# Patient Record
Sex: Female | Born: 1975 | Race: Black or African American | Hispanic: No | Marital: Single | Smoking: Former smoker
Health system: Southern US, Community
[De-identification: ages and names within clinical notes are randomized; demographics above are authoritative.]

## PROBLEM LIST (undated history)

## (undated) ENCOUNTER — Inpatient Hospital Stay (HOSPITAL_COMMUNITY): Payer: Self-pay

## (undated) DIAGNOSIS — T7840XA Allergy, unspecified, initial encounter: Secondary | ICD-10-CM

## (undated) DIAGNOSIS — O24419 Gestational diabetes mellitus in pregnancy, unspecified control: Secondary | ICD-10-CM

## (undated) DIAGNOSIS — R011 Cardiac murmur, unspecified: Secondary | ICD-10-CM

## (undated) DIAGNOSIS — IMO0002 Reserved for concepts with insufficient information to code with codable children: Secondary | ICD-10-CM

## (undated) DIAGNOSIS — O30049 Twin pregnancy, dichorionic/diamniotic, unspecified trimester: Secondary | ICD-10-CM

## (undated) DIAGNOSIS — I10 Essential (primary) hypertension: Secondary | ICD-10-CM

## (undated) HISTORY — DX: Essential (primary) hypertension: I10

## (undated) HISTORY — DX: Cardiac murmur, unspecified: R01.1

## (undated) HISTORY — DX: Allergy, unspecified, initial encounter: T78.40XA

## (undated) HISTORY — PX: OTHER SURGICAL HISTORY: SHX169

## (undated) HISTORY — DX: Morbid (severe) obesity due to excess calories: E66.01

## (undated) HISTORY — DX: Gestational diabetes mellitus in pregnancy, unspecified control: O24.419

---

## 2001-03-03 ENCOUNTER — Encounter: Payer: Self-pay | Admitting: Emergency Medicine

## 2001-03-03 ENCOUNTER — Emergency Department (HOSPITAL_COMMUNITY): Admission: EM | Admit: 2001-03-03 | Discharge: 2001-03-03 | Payer: Self-pay | Admitting: Emergency Medicine

## 2002-05-07 ENCOUNTER — Other Ambulatory Visit: Admission: RE | Admit: 2002-05-07 | Discharge: 2002-05-07 | Payer: Self-pay | Admitting: *Deleted

## 2002-07-06 ENCOUNTER — Encounter: Admission: RE | Admit: 2002-07-06 | Discharge: 2002-07-06 | Payer: Self-pay | Admitting: *Deleted

## 2002-07-06 ENCOUNTER — Encounter: Payer: Self-pay | Admitting: *Deleted

## 2006-09-02 ENCOUNTER — Emergency Department (HOSPITAL_COMMUNITY): Admission: EM | Admit: 2006-09-02 | Discharge: 2006-09-02 | Payer: Self-pay | Admitting: Family Medicine

## 2012-01-17 ENCOUNTER — Other Ambulatory Visit: Payer: Self-pay | Admitting: Obstetrics and Gynecology

## 2012-01-17 DIAGNOSIS — Z1231 Encounter for screening mammogram for malignant neoplasm of breast: Secondary | ICD-10-CM

## 2012-02-04 ENCOUNTER — Ambulatory Visit
Admission: RE | Admit: 2012-02-04 | Discharge: 2012-02-04 | Disposition: A | Discharge status: Home / Self Care | Payer: 59 | Source: Ambulatory Visit | Attending: Obstetrics and Gynecology | Admitting: Obstetrics and Gynecology

## 2012-02-04 DIAGNOSIS — Z1231 Encounter for screening mammogram for malignant neoplasm of breast: Secondary | ICD-10-CM

## 2012-02-07 ENCOUNTER — Other Ambulatory Visit: Payer: Self-pay | Admitting: Obstetrics and Gynecology

## 2012-02-07 DIAGNOSIS — R928 Other abnormal and inconclusive findings on diagnostic imaging of breast: Secondary | ICD-10-CM

## 2012-02-11 ENCOUNTER — Other Ambulatory Visit: Payer: 59

## 2012-02-12 ENCOUNTER — Ambulatory Visit
Admission: RE | Admit: 2012-02-12 | Discharge: 2012-02-12 | Disposition: A | Discharge status: Home / Self Care | Payer: 59 | Source: Ambulatory Visit | Attending: Obstetrics and Gynecology | Admitting: Obstetrics and Gynecology

## 2012-02-12 ENCOUNTER — Other Ambulatory Visit: Payer: Self-pay | Admitting: Obstetrics and Gynecology

## 2012-02-12 DIAGNOSIS — R928 Other abnormal and inconclusive findings on diagnostic imaging of breast: Secondary | ICD-10-CM

## 2012-02-12 DIAGNOSIS — N63 Unspecified lump in unspecified breast: Secondary | ICD-10-CM

## 2012-02-13 ENCOUNTER — Other Ambulatory Visit: Payer: 59

## 2012-02-18 ENCOUNTER — Other Ambulatory Visit: Payer: 59

## 2012-02-25 ENCOUNTER — Encounter (INDEPENDENT_AMBULATORY_CARE_PROVIDER_SITE_OTHER): Payer: Self-pay | Admitting: Surgery

## 2012-02-25 ENCOUNTER — Ambulatory Visit (INDEPENDENT_AMBULATORY_CARE_PROVIDER_SITE_OTHER): Payer: 59 | Admitting: Surgery

## 2012-02-25 ENCOUNTER — Other Ambulatory Visit (INDEPENDENT_AMBULATORY_CARE_PROVIDER_SITE_OTHER): Payer: Self-pay | Admitting: Surgery

## 2012-02-25 VITALS — BP 136/90 | HR 78 | Temp 98.4°F | Ht 65.0 in | Wt 238.2 lb

## 2012-02-25 DIAGNOSIS — N63 Unspecified lump in unspecified breast: Secondary | ICD-10-CM

## 2012-02-25 NOTE — Patient Instructions (Addendum)
Lumpectomy, Breast Conserving Surgery A lumpectomy is breast surgery that removes only part of the breast. Another name used may be partial mastectomy. The amount removed varies. Make sure you understand how much of your breast will be removed. Reasons for a lumpectomy:  Any solid breast mass.  Grouped significant nodularity that may be confused with a solitary breast mass. Lumpectomy is the most common form of breast cancer surgery today. The surgeon removes the portion of your breast which contains the tumor (cancer). This is the lump. Some normal tissue around the lump is also removed to be sure that all the tumor has been removed.  If cancer cells are found in the margins where the breast tissue was removed, your surgeon will do more surgery to remove the remaining cancer tissue. This is called re-excision surgery. Radiation and/or chemotherapy treatments are often given following a lumpectomy to kill any cancer cells that could possibly remain.  REASONS YOU MAY NOT BE ABLE TO HAVE BREAST CONSERVING SURGERY:  The tumor is located in more than one place.  Your breast is small and the tumor is large so the breast would be disfigured.  The entire tumor removal is not successful with a lumpectomy.  You cannot commit to a full course of chemotherapy, radiation therapy or are pregnant and cannot have radiation.  You have previously had radiation to the breast to treat cancer. HOW A LUMPECTOMY IS PERFORMED If overnight nursing is not required following a biopsy, a lumpectomy can be performed as a same-day surgery. This can be done in a hospital, clinic, or surgical center. The anesthesia used will depend on your surgeon. They will discuss this with you. A general anesthetic keeps you sleeping through the procedure. LET YOUR CAREGIVERS KNOW ABOUT THE FOLLOWING:  Allergies  Medications taken including herbs, eye drops, over the counter medications, and creams.  Use of steroids (by mouth or  creams)  Previous problems with anesthetics or Novocaine.  Possibility of pregnancy, if this applies  History of blood clots (thrombophlebitis)  History of bleeding or blood problems.  Previous surgery  Other health problems BEFORE THE PROCEDURE You should be present one hour prior to your procedure unless directed otherwise.  AFTER THE PROCEDURE  After surgery, you will be taken to the recovery area where a nurse will watch and check your progress. Once you're awake, stable, and taking fluids well, barring other problems you will be allowed to go home.  Ice packs applied to your operative site may help with discomfort and keep the swelling down.  A small rubber drain may be placed in the breast for a couple of days to prevent a hematoma from developing in the breast.  A pressure dressing may be applied for 24 to 48 hours to prevent bleeding.  Keep the wound dry.  You may resume a normal diet and activities as directed. Avoid strenuous activities affecting the arm on the side of the biopsy site such as tennis, swimming, heavy lifting (more than 10 pounds) or pulling.  Bruising in the breast is normal following this procedure.  Wearing a bra - even to bed - may be more comfortable and also help keep the dressing on.  Change dressings as directed.  Only take over-the-counter or prescription medicines for pain, discomfort, or fever as directed by your caregiver. Call for your results as instructed by your surgeon. Remember it is your responsibility to get the results of your lumpectomy if your surgeon asked you to follow-up. Do not assume   everything is fine if you have not heard from your caregiver. SEEK MEDICAL CARE IF:   There is increased bleeding (more than a small spot) from the wound.  You notice redness, swelling, or increasing pain in the wound.  Pus is coming from wound.  An unexplained oral temperature above 102 F (38.9 C) develops.  You notice a foul smell  coming from the wound or dressing. SEEK IMMEDIATE MEDICAL CARE IF:   You develop a rash.  You have difficulty breathing.  You have any allergic problems. Document Released: 05/14/2006 Document Revised: 06/25/2011 Document Reviewed: 08/15/2006 [facility] Patient Information 2013 [place], [place].  Lumpectomy, Breast Conserving Surgery Care After Please read the instructions outlined below and refer to this sheet in the next few weeks. These discharge instructions provide you with general information on caring for yourself after you leave the hospital. Your surgeon may also give you specific instructions. While your treatment has been planned according to the most current medical practices available, unavoidable complications occasionally occur. If you have any problems or questions after discharge, please call your surgeon. Reasons for a lumpectomy:  Any solid breast mass.  Grouped significant nodularity that may be confused with a solitary breast mass. AFTER THE PROCEDURE  After surgery, you will be taken to the recovery area where a nurse will watch and check your progress. Once you're awake, stable, and taking fluids well, barring other problems you will be allowed to go home.  Ice packs applied to your operative site may help with discomfort and keep the swelling down.  A small rubber drain may be placed in the incision for a couple of days to prevent a hematoma in the breast.  A pressure dressing may be applied for 24 to 48 hours to prevent bleeding.  Keep the wound dry.  You may resume a normal diet and activities as directed. Avoid strenuous activities affecting the arm on the side of the biopsy site such as tennis, swimming, heavy lifting (more than 10 pounds) or pulling.  Bruising in the breast is normal following this procedure.  Wearing a bra - even to bed - may be more comfortable and also help keep the dressing on.  Change dressings as directed.  Only take  over-the-counter or prescription medicines for pain, discomfort, or fever as directed by your caregiver. Call for your results as instructed by your surgeon. Remember it isyour responsibility to get the results of your lumpectomy if your surgeon asked you to follow-up. Do not assume everything is fine if you have not heard from your caregiver. SEEK MEDICAL CARE IF:   There is increased bleeding (more than a small spot) from the wound.  You notice redness, swelling, or increasing pain in the wound.  Pus is coming from wound.  An unexplained oral temperature above 102 F (38.9 C) develops.  You notice a foul smell coming from the wound or dressing. SEEK IMMEDIATE MEDICAL CARE IF:   You develop a rash.  You have difficulty breathing.  You have any allergic problems. Document Released: 04/18/2006 Document Revised: 06/25/2011 Document Reviewed: 03/21/2007 [facility] Patient Information 2013 [place], [place].

## 2012-02-25 NOTE — Progress Notes (Signed)
Patient ID: [name], female   DOB: 07/10/1975, 36 y.o.   MRN: [id number]  Chief Complaint  Patient presents with  . 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 female.  Patient sent at the request of Dr. Boyle from the [facility] [place] do to right breast mass. This has been present for 9 years. It has been relatively stable. It's not causing any discomfort. The patient though is concerned about it and once it removed. Core biopsy was offered to the patient for diagnosis but she would rather undergo removal of the mass. No history of nipple discharge or breast cancer in the family. HPI  Past Medical History  Diagnosis Date  . Heart murmur   . Hypertension     Past Surgical History  Procedure Date  . Reconstructive surgery 10/1976 &1992    Family History  Problem Relation Age of Onset  . Cancer Mother     lymphoma    Social History History  Substance Use Topics  . Smoking status: Current Every Day Smoker -- 0.5 packs/day    Types: Cigarettes  . Smokeless tobacco: Not on file  . Alcohol Use: Yes     Comment: social    No Known Allergies  No current outpatient prescriptions on file.    Review of Systems Review of Systems  Constitutional: Negative for fever, chills and unexpected weight change.  HENT: Negative for hearing loss, congestion, sore throat, trouble swallowing and voice change.   Eyes: Negative for visual disturbance.  Respiratory: Negative for cough and wheezing.   Cardiovascular: Negative for chest pain, palpitations and leg swelling.  Gastrointestinal: Negative for nausea, vomiting, abdominal pain, diarrhea, constipation, blood in stool, abdominal distention and anal bleeding.  Genitourinary: Negative for hematuria, vaginal bleeding and difficulty urinating.  Musculoskeletal: Negative for arthralgias.  Skin: Negative for rash and wound.  Neurological: Negative for seizures, syncope and headaches.  Hematological:  Negative for adenopathy. Does not bruise/bleed easily.  Psychiatric/Behavioral: Negative for confusion.    Blood pressure 136/90, pulse 78, temperature 98.4 F (36.9 C), temperature source Temporal, height 5' 5" (1.651 m), weight 238 lb 3.2 oz (108.047 kg), last menstrual period 01/28/2012, SpO2 99.00%.  Physical Exam Physical Exam  Constitutional: She appears well-developed and well-nourished.  HENT:  Head: Normocephalic and atraumatic.  Eyes: Conjunctivae normal are normal. Pupils are equal, round, and reactive to light.  Neck: Normal range of motion. Neck supple.  Cardiovascular: Normal rate and regular rhythm.   Pulmonary/Chest: Effort normal and breath sounds normal. Right breast exhibits mass. Right breast exhibits no inverted nipple, no nipple discharge, no skin change and no tenderness. Left breast exhibits no inverted nipple, no mass, no nipple discharge, no skin change and no tenderness. Breasts are symmetrical.    Abdominal: Soft. Bowel sounds are normal. She exhibits no distension. There is no tenderness. There is no rebound.    Data Reviewed Clinical Data: Patient presents for additional views of the right  breast as follow-up to a recent screening mammogram suggesting a  possible mass.  DIGITAL DIAGNOSTIC RIGHT MAMMOGRAM AND RIGHT BREAST ULTRASOUND:  Comparison: None.  Findings: Spot compression images demonstrate a predominately  circumscribed ovoid 2 cm mass over the slightly outer upper right  periareolar region.  On physical exam, I palpate no definite focal abnormality over the  slightly outer upper right periareolar region.  Ultrasound is performed, showing a well defined ovoid hypoechoic  solid mass at the 12   o'clock position of the right breast 1 cm from  the nipple corresponding to the mammographic abnormality as this  measures 0.9 x 1.8 x 2.2 cm. 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  IMPRESSION:  Well defined ovoid hypoechoic solid mass at the 12  o'clock position  of the right breast 1 cm from the nipple measuring 0.9 x 1.8 x 2.2  cm.  RECOMMENDATION:  Management options of 6-month imaging follow-up versus biopsy were  discussed. The patient wishes to proceed with biopsy and desires  excisional biopsy as surgical consultation has been arranged.  I have discussed the findings and recommendations with the patient.  Results were also provided in writing at the conclusion of the  visit.  BI-RADS CATEGORY 4: Suspicious abnormality - biopsy should be  considered.   Assessment    Right  Breast mass 2 cm    Plan    Pt would like this removed.  Recommend right breat needle localized partial mastectomy. The procedure has been discussed with the patient. Alternatives to surgery have been discussed with the patient.  Risks of surgery include bleeding,  Infection,  Seroma formation, death,  and the need for further surgery.   The patient understands and wishes to proceed.       Melanie Kun A. 02/25/2012, 11:34 AM    

## 2012-02-27 ENCOUNTER — Encounter (HOSPITAL_BASED_OUTPATIENT_CLINIC_OR_DEPARTMENT_OTHER): Payer: Self-pay | Admitting: *Deleted

## 2012-03-03 ENCOUNTER — Ambulatory Visit
Admission: RE | Admit: 2012-03-03 | Discharge: 2012-03-03 | Disposition: A | Discharge status: Home / Self Care | Payer: 59 | Source: Ambulatory Visit | Attending: Surgery | Admitting: Surgery

## 2012-03-03 ENCOUNTER — Encounter (HOSPITAL_BASED_OUTPATIENT_CLINIC_OR_DEPARTMENT_OTHER)
Admission: RE | Admit: 2012-03-03 | Discharge: 2012-03-03 | Disposition: A | Discharge status: Home / Self Care | Payer: 59 | Source: Ambulatory Visit | Attending: Surgery | Admitting: Surgery

## 2012-03-03 LAB — COMPREHENSIVE METABOLIC PANEL
ALT: 13 U/L (ref 0–35)
Calcium: 9.5 mg/dL (ref 8.4–10.5)
Creatinine, Ser: 0.67 mg/dL (ref 0.50–1.10)
GFR calc Af Amer: >90 mL/min (ref >90)
GFR calc non Af Amer: >90 mL/min (ref >90)
Glucose, Bld: 114 mg/dL — ABNORMAL HIGH (ref 70–99)
Sodium: 136 mEq/L (ref 135–145)
Total Protein: 7.7 g/dL (ref 6.0–8.3)

## 2012-03-05 ENCOUNTER — Encounter (HOSPITAL_BASED_OUTPATIENT_CLINIC_OR_DEPARTMENT_OTHER): Payer: Self-pay | Admitting: *Deleted

## 2012-03-05 ENCOUNTER — Encounter (HOSPITAL_BASED_OUTPATIENT_CLINIC_OR_DEPARTMENT_OTHER)
Admission: RE | Disposition: A | Discharge status: Home / Self Care | Payer: Self-pay | Source: Ambulatory Visit | Attending: Surgery

## 2012-03-05 ENCOUNTER — Ambulatory Visit (HOSPITAL_BASED_OUTPATIENT_CLINIC_OR_DEPARTMENT_OTHER): Payer: 59 | Admitting: Certified Registered"

## 2012-03-05 ENCOUNTER — Ambulatory Visit
Admission: RE | Admit: 2012-03-05 | Discharge: 2012-03-05 | Disposition: A | Discharge status: Home / Self Care | Payer: 59 | Source: Ambulatory Visit | Attending: Surgery | Admitting: Surgery

## 2012-03-05 ENCOUNTER — Encounter (HOSPITAL_BASED_OUTPATIENT_CLINIC_OR_DEPARTMENT_OTHER): Payer: Self-pay | Admitting: Certified Registered"

## 2012-03-05 ENCOUNTER — Ambulatory Visit (HOSPITAL_BASED_OUTPATIENT_CLINIC_OR_DEPARTMENT_OTHER)
Admission: RE | Admit: 2012-03-05 | Discharge: 2012-03-05 | Disposition: A | Discharge status: Home / Self Care | Payer: 59 | Source: Ambulatory Visit | Attending: Surgery | Admitting: Surgery

## 2012-03-05 DIAGNOSIS — Z01812 Encounter for preprocedural laboratory examination: Secondary | ICD-10-CM | POA: Insufficient documentation

## 2012-03-05 DIAGNOSIS — F17200 Nicotine dependence, unspecified, uncomplicated: Secondary | ICD-10-CM | POA: Insufficient documentation

## 2012-03-05 DIAGNOSIS — I10 Essential (primary) hypertension: Secondary | ICD-10-CM | POA: Insufficient documentation

## 2012-03-05 DIAGNOSIS — N63 Unspecified lump in unspecified breast: Secondary | ICD-10-CM

## 2012-03-05 DIAGNOSIS — D249 Benign neoplasm of unspecified breast: Secondary | ICD-10-CM | POA: Insufficient documentation

## 2012-03-05 HISTORY — PX: PARTIAL MASTECTOMY WITH NEEDLE LOCALIZATION: SHX6008

## 2012-03-05 SURGERY — PARTIAL MASTECTOMY WITH NEEDLE LOCALIZATION
Anesthesia: General | Site: Breast | Laterality: Right | Wound class: Clean

## 2012-03-05 MED ORDER — PROPOFOL 10 MG/ML IV BOLUS
INTRAVENOUS | Status: DC | PRN
  Administered 2012-03-05: 200 mg via INTRAVENOUS

## 2012-03-05 MED ORDER — FENTANYL CITRATE 0.05 MG/ML IJ SOLN
INTRAMUSCULAR | Status: DC | PRN
  Administered 2012-03-05: 100 ug via INTRAVENOUS

## 2012-03-05 MED ORDER — OXYCODONE HCL 5 MG/5ML PO SOLN: 5.0000 mg | Freq: Once | ORAL | Status: AC | PRN

## 2012-03-05 MED ORDER — BUPIVACAINE-EPINEPHRINE 0.25% -1:200000 IJ SOLN
INTRAMUSCULAR | Status: DC | PRN
  Administered 2012-03-05: 10 mL

## 2012-03-05 MED ORDER — ONDANSETRON HCL 4 MG/2ML IJ SOLN: 4.0000 mg | Freq: Once | INTRAMUSCULAR | Status: DC | PRN

## 2012-03-05 MED ORDER — 0.9 % SODIUM CHLORIDE (POUR BTL) OPTIME
TOPICAL | Status: DC | PRN
  Administered 2012-03-05: 200 mL

## 2012-03-05 MED ORDER — OXYCODONE HCL 5 MG PO TABS
5.0000 mg | ORAL_TABLET | Freq: Once | ORAL | Status: AC | PRN
  Administered 2012-03-05: 5 mg via ORAL

## 2012-03-05 MED ORDER — HYDROCODONE-ACETAMINOPHEN 5-325 MG PO TABS: 1.0000 | ORAL_TABLET | Freq: Four times a day (QID) | ORAL | Status: DC | PRN

## 2012-03-05 MED ORDER — HYDROMORPHONE HCL PF 1 MG/ML IJ SOLN: 0.2500 mg | INTRAMUSCULAR | Status: DC | PRN

## 2012-03-05 MED ORDER — ONDANSETRON HCL 4 MG/2ML IJ SOLN
INTRAMUSCULAR | Status: DC | PRN
  Administered 2012-03-05: 4 mg via INTRAVENOUS

## 2012-03-05 MED ORDER — DEXAMETHASONE SODIUM PHOSPHATE 4 MG/ML IJ SOLN
INTRAMUSCULAR | Status: DC | PRN
  Administered 2012-03-05: 8 mg via INTRAVENOUS

## 2012-03-05 MED ORDER — MIDAZOLAM HCL 5 MG/5ML IJ SOLN
INTRAMUSCULAR | Status: DC | PRN
  Administered 2012-03-05: 2 mg via INTRAVENOUS

## 2012-03-05 MED ORDER — DEXTROSE 5 % IV SOLN
3.0000 g | INTRAVENOUS | Status: AC
  Administered 2012-03-05: 3 g via INTRAVENOUS

## 2012-03-05 MED ORDER — LIDOCAINE HCL (CARDIAC) 20 MG/ML IV SOLN
INTRAVENOUS | Status: DC | PRN
  Administered 2012-03-05: 50 mg via INTRAVENOUS

## 2012-03-05 MED ORDER — CHLORHEXIDINE GLUCONATE 4 % EX LIQD: 1.0000 "application " | Freq: Once | CUTANEOUS | Status: DC

## 2012-03-05 MED ORDER — LACTATED RINGERS IV SOLN
INTRAVENOUS | Status: DC
  Administered 2012-03-05: 12:00:00 via INTRAVENOUS

## 2012-03-05 SURGICAL SUPPLY — 45 items
ADH SKN CLS APL DERMABOND .7 (GAUZE/BANDAGES/DRESSINGS) ×1
BLADE SURG 15 STRL LF DISP TIS (BLADE) ×1 IMPLANT
BLADE SURG 15 STRL SS (BLADE) ×2
CANISTER SUCTION 1200CC (MISCELLANEOUS) ×2 IMPLANT
CHLORAPREP W/TINT 26ML (MISCELLANEOUS) ×2 IMPLANT
CLIP TI WIDE RED SMALL 6 (CLIP) IMPLANT
CLOTH BEACON ORANGE TIMEOUT ST (SAFETY) ×2 IMPLANT
COVER MAYO STAND STRL (DRAPES) ×2 IMPLANT
COVER TABLE BACK 60X90 (DRAPES) ×2 IMPLANT
DECANTER SPIKE VIAL GLASS SM (MISCELLANEOUS) ×1 IMPLANT
DERMABOND ADVANCED (GAUZE/BANDAGES/DRESSINGS) ×1
DERMABOND ADVANCED .7 DNX12 (GAUZE/BANDAGES/DRESSINGS) ×1 IMPLANT
DEVICE DUBIN W/COMP PLATE 8390 (MISCELLANEOUS) ×1 IMPLANT
DRAPE LAPAROSCOPIC ABDOMINAL (DRAPES) IMPLANT
DRAPE PED LAPAROTOMY (DRAPES) ×2 IMPLANT
DRAPE UTILITY XL STRL (DRAPES) ×2 IMPLANT
ELECT COATED BLADE 2.86 ST (ELECTRODE) ×2 IMPLANT
ELECT REM PT RETURN 9FT ADLT (ELECTROSURGICAL) ×2
ELECTRODE REM PT RTRN 9FT ADLT (ELECTROSURGICAL) ×1 IMPLANT
GLOVE BIO SURGEON STRL SZ7 (GLOVE) ×1 IMPLANT
GLOVE BIOGEL PI IND STRL 7.0 (GLOVE) IMPLANT
GLOVE BIOGEL PI IND STRL 8 (GLOVE) ×1 IMPLANT
GLOVE BIOGEL PI INDICATOR 7.0 (GLOVE) ×1
GLOVE BIOGEL PI INDICATOR 8 (GLOVE) ×1
GLOVE ECLIPSE 8.0 STRL XLNG CF (GLOVE) ×2 IMPLANT
GOWN PREVENTION PLUS XLARGE (GOWN DISPOSABLE) ×3 IMPLANT
KIT MARKER MARGIN INK (KITS) IMPLANT
NDL HYPO 25X1 1.5 SAFETY (NEEDLE) ×1 IMPLANT
NEEDLE HYPO 25X1 1.5 SAFETY (NEEDLE) ×2 IMPLANT
NS IRRIG 1000ML POUR BTL (IV SOLUTION) ×2 IMPLANT
PACK BASIN DAY SURGERY FS (CUSTOM PROCEDURE TRAY) ×2 IMPLANT
PENCIL BUTTON HOLSTER BLD 10FT (ELECTRODE) ×2 IMPLANT
SLEEVE SCD COMPRESS KNEE MED (MISCELLANEOUS) ×2 IMPLANT
SPONGE LAP 4X18 X RAY DECT (DISPOSABLE) ×2 IMPLANT
STAPLER VISISTAT 35W (STAPLE) IMPLANT
SUT MON AB 4-0 PC3 18 (SUTURE) ×2 IMPLANT
SUT SILK 2 0 SH (SUTURE) IMPLANT
SUT VIC AB 3-0 SH 27 (SUTURE) ×2
SUT VIC AB 3-0 SH 27X BRD (SUTURE) ×1 IMPLANT
SYR CONTROL 10ML LL (SYRINGE) ×2 IMPLANT
TOWEL OR 17X24 6PK STRL BLUE (TOWEL DISPOSABLE) ×3 IMPLANT
TOWEL OR NON WOVEN STRL DISP B (DISPOSABLE) ×2 IMPLANT
TUBE CONNECTING 20X1/4 (TUBING) ×2 IMPLANT
WATER STERILE IRR 1000ML POUR (IV SOLUTION) ×1 IMPLANT
YANKAUER SUCT BULB TIP NO VENT (SUCTIONS) ×2 IMPLANT

## 2012-03-05 NOTE — Interval H&P Note (Signed)
History and Physical Interval Note:  03/05/2012 12:24 PM  [name]  has presented today for surgery, with the diagnosis of right breast mass  The various methods of treatment have been discussed with the patient and family. After consideration of risks, benefits and other options for treatment, the patient has consented to  Procedure(s) (LRB) with comments: PARTIAL MASTECTOMY WITH NEEDLE LOCALIZATION (Right) as a surgical intervention .  The patient's history has been reviewed, patient examined, no change in status, stable for surgery.  I have reviewed the patient's chart and labs.  Questions were answered to the patient's satisfaction.     Darryl Blumenstein A.

## 2012-03-05 NOTE — Transfer of Care (Signed)
Immediate Anesthesia Transfer of Care Note  Patient: [name]  Procedure(s) Performed: Procedure(s) (LRB) with comments: PARTIAL MASTECTOMY WITH NEEDLE LOCALIZATION (Right)  Patient Location: PACU  Anesthesia Type:General  Level of Consciousness: awake and alert   Airway & Oxygen Therapy: Patient Spontanous Breathing and Patient connected to face mask oxygen  Post-op Assessment: Report given to PACU RN and Post -op Vital signs reviewed and stable  Post vital signs: Reviewed and stable  Complications: No apparent anesthesia complications

## 2012-03-05 NOTE — Anesthesia Procedure Notes (Signed)
Procedure Name: LMA Insertion Performed by: Fernie Grimm, [place] Pre-anesthesia Checklist: Patient identified, Timeout performed, Emergency Drugs available, Suction available and Patient being monitored Patient Re-evaluated:Patient Re-evaluated prior to inductionOxygen Delivery Method: Circle system utilized Preoxygenation: Pre-oxygenation with 100% oxygen Intubation Type: IV induction Ventilation: Mask ventilation without difficulty LMA: LMA inserted LMA Size: 4.0 Number of attempts: 1 Placement Confirmation: breath sounds checked- equal and bilateral and positive ETCO2 Dental Injury: Teeth and Oropharynx as per pre-operative assessment      

## 2012-03-05 NOTE — Anesthesia Preprocedure Evaluation (Addendum)
Anesthesia Evaluation  Patient identified by MRN, date of birth, ID band Patient awake    Reviewed: Allergy & Precautions, H&P , NPO status , Patient's Chart, lab work & pertinent test results  Airway Mallampati: I TM Distance: >3 FB Neck ROM: Full    Dental  (+) Teeth Intact, Dental Advisory Given and Partial Upper   Pulmonary  breath sounds clear to auscultation        Cardiovascular Rhythm:Regular Rate:Normal     Neuro/Psych    GI/Hepatic   Endo/Other    Renal/GU      Musculoskeletal   Abdominal   Peds  Hematology   Anesthesia Other Findings   Reproductive/Obstetrics                          Anesthesia Physical Anesthesia Plan  ASA: II  Anesthesia Plan: General   Post-op Pain Management:    Induction: Intravenous  Airway Management Planned: LMA  Additional Equipment:   Intra-op Plan:   Post-operative Plan: Extubation in OR  Informed Consent: I have reviewed the patients History and Physical, chart, labs and discussed the procedure including the risks, benefits and alternatives for the proposed anesthesia with the patient or authorized representative who has indicated his/her understanding and acceptance.   Dental advisory given  Plan Discussed with: CRNA, Anesthesiologist and Surgeon  Anesthesia Plan Comments:         Anesthesia Quick Evaluation

## 2012-03-05 NOTE — Anesthesia Postprocedure Evaluation (Signed)
  Anesthesia Post-op Note  Patient: [name]  Procedure(s) Performed: Procedure(s) (LRB) with comments: PARTIAL MASTECTOMY WITH NEEDLE LOCALIZATION (Right)  Patient Location: PACU  Anesthesia Type:General  Level of Consciousness: awake, alert  and oriented  Airway and Oxygen Therapy: Patient Spontanous Breathing and Patient connected to face mask oxygen  Post-op Pain: none  Post-op Assessment: Post-op Vital signs reviewed  Post-op Vital Signs: Reviewed  Complications: No apparent anesthesia complications

## 2012-03-05 NOTE — Op Note (Signed)
Preoperative diagnosis: right breast mass  Postop diagnosis: Same  Procedure:  Right Partial mastectomy with wire localization  Surgeon: Harriette Bouillon M.D.  Anesthesia: LMA with 0.25% Sensorcaine local  EBL: Less than 40 cc  Specimen: Right  Breast mass with wire  verified by radiography to pathology  Drains: None  Indications for procedure: The patient presents with a right  breast mass. The patient wants to proceed with partial mastectomy with wire localization.  Description of procedure: The patient was seen in the holding area and the appropriate side was marked. Questions are answered. Wire localization was done by  radiology. The patient was taken back to the operating room and placed supine on the operating room table. After induction of general anesthesia, chest and upper extremities   were prepped and draped in a sterile fashion. Timeout was done and she received preoperative antibiotics. Curvilinear incision was made around the wire insertion site in the right lateral breast. All tissue around the wire was excised and hemostasis was achieved with cautery. The area was removed in its entirety upon gross examination. Gross margin negative. Radiograph revealed the mass, wire and clip to be in the specimen. The wound was closed in layers using 3-0 Vicryl and 4-0 Monocryl subcuticular stitch. Dermabond applied. All final counts found to be correct. Patient awoke extubated taken recovery in satisfactory condition.

## 2012-03-05 NOTE — H&P (View-Only) (Signed)
Patient ID: [name], female   DOB: May 29, 1975, 36 y.o.   MRN: [id number]  Chief Complaint  Patient presents with  . 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 female.  Patient sent at the request of Dr. Micheline Maze from the breast [facility] do to right breast mass. This has been present for 9 years. It has been relatively stable. It's not causing any discomfort. The patient though is concerned about it and once it removed. Core biopsy was offered to the patient for diagnosis but she would rather undergo removal of the mass. No history of nipple discharge or breast cancer in the family. HPI  Past Medical History  Diagnosis Date  . Heart murmur   . Hypertension     Past Surgical History  Procedure Date  . Reconstructive surgery Sep 08, 1976 &1992    Family History  Problem Relation Age of Onset  . Cancer Mother     lymphoma    Social History History  Substance Use Topics  . Smoking status: Current Every Day Smoker -- 0.5 packs/day    Types: Cigarettes  . Smokeless tobacco: Not on file  . Alcohol Use: Yes     Comment: social    No Known Allergies  No current outpatient prescriptions on file.    Review of Systems Review of Systems  Constitutional: Negative for fever, chills and unexpected weight change.  HENT: Negative for hearing loss, congestion, sore throat, trouble swallowing and voice change.   Eyes: Negative for visual disturbance.  Respiratory: Negative for cough and wheezing.   Cardiovascular: Negative for chest pain, palpitations and leg swelling.  Gastrointestinal: Negative for nausea, vomiting, abdominal pain, diarrhea, constipation, blood in stool, abdominal distention and anal bleeding.  Genitourinary: Negative for hematuria, vaginal bleeding and difficulty urinating.  Musculoskeletal: Negative for arthralgias.  Skin: Negative for rash and wound.  Neurological: Negative for seizures, syncope and headaches.  Hematological:  Negative for adenopathy. Does not bruise/bleed easily.  Psychiatric/Behavioral: Negative for confusion.    Blood pressure 136/90, pulse 78, temperature 98.4 F (36.9 C), temperature source Temporal, height 5\' 5"  (1.651 m), weight 238 lb 3.2 oz (108.047 kg), last menstrual period 01/28/2012, SpO2 99.00%.  Physical Exam Physical Exam  Constitutional: She appears well-developed and well-nourished.  HENT:  Head: Normocephalic and atraumatic.  Eyes: Conjunctivae normal are normal. Pupils are equal, round, and reactive to light.  Neck: Normal range of motion. Neck supple.  Cardiovascular: Normal rate and regular rhythm.   Pulmonary/Chest: Effort normal and breath sounds normal. Right breast exhibits mass. Right breast exhibits no inverted nipple, no nipple discharge, no skin change and no tenderness. Left breast exhibits no inverted nipple, no mass, no nipple discharge, no skin change and no tenderness. Breasts are symmetrical.    Abdominal: Soft. Bowel sounds are normal. She exhibits no distension. There is no tenderness. There is no rebound.    Data Reviewed Clinical Data: Patient presents for additional views of the right  breast as follow-up to a recent screening mammogram suggesting a  possible mass.  DIGITAL DIAGNOSTIC RIGHT MAMMOGRAM AND RIGHT BREAST ULTRASOUND:  Comparison: None.  Findings: Spot compression images demonstrate a predominately  circumscribed ovoid 2 cm mass over the slightly outer upper right  periareolar region.  On physical exam, I palpate no definite focal abnormality over the  slightly outer upper right periareolar region.  Ultrasound is performed, showing a well defined ovoid hypoechoic  solid mass at the 12  o'clock position of the right breast 1 cm from  the nipple corresponding to the mammographic abnormality as this  measures 0.9 x 1.8 x 2.2 cm. 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  IMPRESSION:  Well defined ovoid hypoechoic solid mass at the 12  o'clock position  of the right breast 1 cm from the nipple measuring 0.9 x 1.8 x 2.2  cm.  RECOMMENDATION:  Management options of 57-month imaging follow-up versus biopsy were  discussed. The patient wishes to proceed with biopsy and desires  excisional biopsy as surgical consultation has been arranged.  I have discussed the findings and recommendations with the patient.  Results were also provided in writing at the conclusion of the  visit.  BI-RADS CATEGORY 4: Suspicious abnormality - biopsy should be  considered.   Assessment    Right  Breast mass 2 cm    Plan    Pt would like this removed.  Recommend right breat needle localized partial mastectomy. The procedure has been discussed with the patient. Alternatives to surgery have been discussed with the patient.  Risks of surgery include bleeding,  Infection,  Seroma formation, death,  and the need for further surgery.   The patient understands and wishes to proceed.       Requan Hardge A. 02/25/2012, 11:34 AM

## 2012-03-06 ENCOUNTER — Encounter (HOSPITAL_BASED_OUTPATIENT_CLINIC_OR_DEPARTMENT_OTHER): Payer: Self-pay | Admitting: Surgery

## 2012-03-07 ENCOUNTER — Telehealth (INDEPENDENT_AMBULATORY_CARE_PROVIDER_SITE_OTHER): Payer: Self-pay | Admitting: General Surgery

## 2012-03-07 NOTE — Telephone Encounter (Signed)
Pt called for path results; told there was no malignancy found.

## 2012-03-18 ENCOUNTER — Encounter (INDEPENDENT_AMBULATORY_CARE_PROVIDER_SITE_OTHER): Payer: 59 | Admitting: Surgery

## 2012-03-20 ENCOUNTER — Telehealth (INDEPENDENT_AMBULATORY_CARE_PROVIDER_SITE_OTHER): Payer: Self-pay

## 2012-03-20 NOTE — Telephone Encounter (Signed)
The pt called reporting a little bit of bloody drainage in her bra last night.  She has no fever and no redness.  She has been showering and she placed a sterile gauze pad in her bra today.  It is dry now. She states the breast is a little bigger than the other but it always has been.  Her dermabond has started to peel up but the incision is not open.   I told her to watch this for now.  It may not happen again.  I advised keep showering and gently pat it dry.  Keep a dry gauze in the bra and let us know if it happens again.  She has an appointment with Dr Luisa Hart 12/16 but I told her we can have her seen tomorrow before the weekend if we need.

## 2012-03-31 ENCOUNTER — Encounter (INDEPENDENT_AMBULATORY_CARE_PROVIDER_SITE_OTHER): Payer: Self-pay | Admitting: Surgery

## 2012-03-31 ENCOUNTER — Ambulatory Visit (INDEPENDENT_AMBULATORY_CARE_PROVIDER_SITE_OTHER): Payer: 59 | Admitting: Surgery

## 2012-03-31 VITALS — BP 124/88 | HR 72 | Temp 97.6°F | Resp 16 | Ht 66.0 in | Wt 237.4 lb

## 2012-03-31 DIAGNOSIS — Z9889 Other specified postprocedural states: Secondary | ICD-10-CM

## 2012-03-31 NOTE — Patient Instructions (Signed)
RETURN AS NEEDED 

## 2012-03-31 NOTE — Progress Notes (Signed)
Patient returns after left breast partial mastectomy. Final pathology showed a 2.2 cm fibroadenoma. She does have some soreness around the incision left nipple.  Exam: Incision clean dry and intact without signs of infection  Impression: Left breast fibroadenoma status post excision  Plan: Return as needed. Resume full activity.

## 2012-12-02 LAB — OB RESULTS CONSOLE ABO/RH: RH TYPE: NEGATIVE

## 2012-12-02 LAB — OB RESULTS CONSOLE HIV ANTIBODY (ROUTINE TESTING): HIV: NONREACTIVE

## 2012-12-02 LAB — OB RESULTS CONSOLE RPR: RPR: NONREACTIVE

## 2012-12-02 LAB — OB RESULTS CONSOLE RUBELLA ANTIBODY, IGM: RUBELLA: IMMUNE

## 2012-12-02 LAB — OB RESULTS CONSOLE HEPATITIS B SURFACE ANTIGEN: HEP B S AG: NEGATIVE

## 2012-12-02 LAB — OB RESULTS CONSOLE ANTIBODY SCREEN: ANTIBODY SCREEN: NEGATIVE

## 2013-04-14 ENCOUNTER — Encounter: Payer: 59 | Attending: Obstetrics and Gynecology

## 2013-04-14 VITALS — Ht 66.0 in | Wt 268.7 lb

## 2013-04-14 DIAGNOSIS — Z713 Dietary counseling and surveillance: Secondary | ICD-10-CM | POA: Insufficient documentation

## 2013-04-14 DIAGNOSIS — O99810 Abnormal glucose complicating pregnancy: Secondary | ICD-10-CM

## 2013-04-14 NOTE — Progress Notes (Signed)
  Patient was seen on 04/14/13 for Gestational Diabetes self-management class at the [facility]. The following learning objectives were met by the patient during this course:   States the definition of Gestational Diabetes  States why dietary management is important in controlling blood glucose  Describes the effects of carbohydrates on blood glucose levels  Demonstrates ability to create a balanced meal plan  Demonstrates carbohydrate counting   States when to check blood glucose levels  Demonstrates proper blood glucose monitoring techniques  States the effect of stress and exercise on blood glucose levels  States the importance of limiting caffeine and abstaining from alcohol and smoking  Plan:  Aim for 2 Carb Choices per meal (30 grams) +/- 1 either way for breakfast Aim for 3 Carb Choices per meal (45 grams) +/- 1 either way from lunch and dinner Aim for 1-2 Carbs per snack Begin reading food labels for Total Carbohydrate and sugar grams of foods Consider  increasing your activity level by walking daily as tolerated Begin checking BG before breakfast and 1-2 hours after first bit of breakfast, lunch and dinner after  as directed by MD  Take medication  as directed by MD  Blood glucose monitor given:  One Touch Ultra Mini Self Monitoring Kit Lot # [id number] X Exp: 06/2014 Blood glucose reading: 83  Patient instructed to monitor glucose levels: FBS: 60 - <90 1 hour: <140 2 hour: <120  Patient received the following handouts:  Nutrition Diabetes and Pregnancy  Carbohydrate Counting List  Meal Planning worksheet  Patient will be seen for follow-up as needed.

## 2013-05-04 ENCOUNTER — Inpatient Hospital Stay (HOSPITAL_COMMUNITY)
Admission: AD | Admit: 2013-05-04 | Discharge: 2013-05-05 | Disposition: A | Discharge status: Home / Self Care | Payer: 59 | Source: Ambulatory Visit | Attending: Obstetrics and Gynecology | Admitting: Obstetrics and Gynecology

## 2013-05-04 DIAGNOSIS — O99810 Abnormal glucose complicating pregnancy: Secondary | ICD-10-CM | POA: Insufficient documentation

## 2013-05-04 DIAGNOSIS — O30009 Twin pregnancy, unspecified number of placenta and unspecified number of amniotic sacs, unspecified trimester: Secondary | ICD-10-CM | POA: Insufficient documentation

## 2013-05-04 DIAGNOSIS — O10019 Pre-existing essential hypertension complicating pregnancy, unspecified trimester: Secondary | ICD-10-CM | POA: Insufficient documentation

## 2013-05-04 DIAGNOSIS — O163 Unspecified maternal hypertension, third trimester: Secondary | ICD-10-CM | POA: Diagnosis present

## 2013-05-05 ENCOUNTER — Encounter (HOSPITAL_COMMUNITY): Payer: Self-pay | Admitting: *Deleted

## 2013-05-05 DIAGNOSIS — O163 Unspecified maternal hypertension, third trimester: Secondary | ICD-10-CM | POA: Diagnosis present

## 2013-05-05 LAB — CBC
HCT: 32.2 % — ABNORMAL LOW (ref 36.0–46.0)
Hemoglobin: 11 g/dL — ABNORMAL LOW (ref 12.0–15.0)
MCH: 31.8 pg (ref 26.0–34.0)
MCHC: 34.2 g/dL (ref 30.0–36.0)
MCV: 93.1 fL (ref 78.0–100.0)
Platelets: 186 10*3/uL (ref 150–400)
RBC: 3.46 MIL/uL — ABNORMAL LOW (ref 3.87–5.11)
RDW: 13.1 % (ref 11.5–15.5)
WBC: 6.9 10*3/uL (ref 4.0–10.5)

## 2013-05-05 LAB — PROTEIN / CREATININE RATIO, URINE
Creatinine, Urine: 91.64 mg/dL
Protein Creatinine Ratio: 0.2 — ABNORMAL HIGH (ref 0.00–0.15)
Total Protein, Urine: 18.7 mg/dL

## 2013-05-05 LAB — URIC ACID: Uric Acid, Serum: 5.6 mg/dL (ref 2.4–7.0)

## 2013-05-05 LAB — COMPREHENSIVE METABOLIC PANEL
ALT: 18 U/L (ref 0–35)
AST: 20 U/L (ref 0–37)
Albumin: 2.7 g/dL — ABNORMAL LOW (ref 3.5–5.2)
Alkaline Phosphatase: 149 U/L — ABNORMAL HIGH (ref 39–117)
BUN: 7 mg/dL (ref 6–23)
CO2: 20 mEq/L (ref 19–32)
Calcium: 9.6 mg/dL (ref 8.4–10.5)
Chloride: 101 mEq/L (ref 96–112)
Creatinine, Ser: 0.66 mg/dL (ref 0.50–1.10)
GFR calc Af Amer: >90 mL/min (ref >90)
GFR calc non Af Amer: >90 mL/min (ref >90)
Glucose, Bld: 110 mg/dL — ABNORMAL HIGH (ref 70–99)
Potassium: 3.8 mEq/L (ref 3.7–5.3)
Sodium: 136 mEq/L — ABNORMAL LOW (ref 137–147)
Total Bilirubin: <0.2 mg/dL — ABNORMAL LOW (ref 0.3–1.2)
Total Protein: 6.2 g/dL (ref 6.0–8.3)

## 2013-05-05 MED ORDER — LABETALOL HCL 100 MG PO TABS
200.0000 mg | ORAL_TABLET | Freq: Once | ORAL | Status: DC
  Filled 2013-05-05: qty 2

## 2013-05-05 NOTE — MAU Note (Signed)
Pt reports she started Labetalol on Sunday, had to increase dose today after MD visit. B/P tonight 166/104 and 181/104.

## 2013-05-05 NOTE — Progress Notes (Signed)
Dr. Ronita Hipps states to have Emelda Fear CNM evaluate patient.

## 2013-05-05 NOTE — Discharge Instructions (Signed)
Monitor BP at home with regular cuff on wrist - align arrows with thumb towards inner wrist for accurate BP measurement Take labetalol 200mg  twice daily - AM dose with breakfast then 12 hours later  Increase water intake daily - reduce all salt intake in diet  Call office if BP greater than 160/100 - any headache - any blurred vision - any constant pain across top of abdomen - any decrease in fetal movements - any regular contractions every 5 minutes - or water breaks.

## 2013-05-05 NOTE — MAU Provider Note (Signed)
  History     CSN: [id number]  Arrival date and time: 05/04/13 2358      Chief Complaint  Patient presents with  . Hypertension   HPI  Concerned about her BP and decided to come to hospital instead of office in AM Checking BP at home states really high - 150/100 to 184/112 all day (auto meter with regular cuff) Does not feel like medication is working No headache / no vision changes / no epigastric pain  Past Medical History  Diagnosis Date  . Hypertension     no meds now  . Heart murmur     as infant    Past Surgical History  Procedure Laterality Date  . Reconstructive surgery  10/08/76 &1992  . Partial mastectomy with needle localization  03/05/2012    Procedure: PARTIAL MASTECTOMY WITH NEEDLE LOCALIZATION;  Surgeon: Marcello Moores A. Cornett, MD;  Location: [facility];  Service: General;  Laterality: Right;    Family History  Problem Relation Age of Onset  . Cancer Mother     lymphoma    History  Substance Use Topics  . Smoking status: Former Smoker -- 0.50 packs/day    Types: Cigarettes    Quit date: 10/03/2012  . Smokeless tobacco: Never Used  . Alcohol Use: No     Comment: social    Allergies: No Known Allergies  No prescriptions prior to admission    ROS Physical Exam   Blood pressure 163/105, pulse 92, temperature 97.6 F (36.4 C), temperature source Oral, resp. rate 18, height 5\' 6"  (1.676 m), weight 123.378 kg (272 lb), SpO2 99.00%.  BP: (cuff on upper arm) 160/102 - 154/88 - 163/105 - 155/93 - 184/100          turned onto side laying on cuff 201/154   Adjusted BP cuff to top arm (resting left side) - regular cuff on lower extremity 138/65 - 131/68 - 132/58 verified BP with home meter - results consistent with BP monitor here in MAU  Physical Exam Alert and oriented x 3 - turned to lateral positioning & cuff adjusted on arm Abdomen soft and non-tender / uterus gravid and non-tender with twin gestation Defer VE Extremities - DTR 1+  / trace edema  FHR x 2: A-150  / B-148 toco - no ctx or UI  CBC: hgb 11.0 / hct 32.2 / plt 186 CMP: SGOT 20   / SGPT 18   / uric acid 5.6   / creatinine 0.66           Glucose 110   MAU Course  Procedures Repeat PIH labs - normal Serial BPs - normal range with adjustment in BP cuff  Assessment and Plan  32 weeks Twin gestation [id number] Chronic hypertension - elevated BP with recent initiation of anti-hypertensive in past 2 days  1) serial BP and repeat PIH labs 2) Labetalol 200mg  BID - next dose in AM 3) OV in am as scheduled for BP recheck 4) education on use of regular cuff - weight over 250 lbs & needs to use cuff on lower portion of     extremity for home monitoring (wrist or ankle - align arrow with thumb or big toe for accurate results)       Must use LARGE cuff upper arm or regular cuff on lower extremity for accurate BP reading  5) DC home    Artelia Laroche CNM MSN [facility] 05/05/2013, 1:35 AM

## 2013-06-03 ENCOUNTER — Other Ambulatory Visit: Payer: Self-pay | Admitting: Obstetrics and Gynecology

## 2013-06-04 ENCOUNTER — Encounter (HOSPITAL_COMMUNITY): Payer: Self-pay

## 2013-06-04 ENCOUNTER — Encounter (HOSPITAL_COMMUNITY)
Admission: RE | Admit: 2013-06-04 | Discharge: 2013-06-04 | Disposition: A | Discharge status: Home / Self Care | Payer: 59 | Source: Ambulatory Visit | Attending: Obstetrics and Gynecology | Admitting: Obstetrics and Gynecology

## 2013-06-04 LAB — BASIC METABOLIC PANEL
BUN: 11 mg/dL (ref 6–23)
CO2: 21 mEq/L (ref 19–32)
CREATININE: 1.02 mg/dL (ref 0.50–1.10)
Calcium: 8.9 mg/dL (ref 8.4–10.5)
Chloride: 100 mEq/L (ref 96–112)
GFR, EST AFRICAN AMERICAN: 81 mL/min — AB (ref >90)
GFR, EST NON AFRICAN AMERICAN: 70 mL/min — AB (ref >90)
Glucose, Bld: 156 mg/dL — ABNORMAL HIGH (ref 70–99)
POTASSIUM: 4.2 meq/L (ref 3.7–5.3)
Sodium: 135 mEq/L — ABNORMAL LOW (ref 137–147)

## 2013-06-04 LAB — CBC
HEMATOCRIT: 35.7 % — AB (ref 36.0–46.0)
Hemoglobin: 12.2 g/dL (ref 12.0–15.0)
MCH: 31.8 pg (ref 26.0–34.0)
MCHC: 34.2 g/dL (ref 30.0–36.0)
MCV: 93 fL (ref 78.0–100.0)
PLATELETS: 142 10*3/uL — AB (ref 150–400)
RBC: 3.84 MIL/uL — ABNORMAL LOW (ref 3.87–5.11)
RDW: 13.6 % (ref 11.5–15.5)
WBC: 7.1 10*3/uL (ref 4.0–10.5)

## 2013-06-04 LAB — TYPE AND SCREEN
ABO/RH(D): A POS
Antibody Screen: NEGATIVE

## 2013-06-04 LAB — ABO/RH: ABO/RH(D): A POS

## 2013-06-04 MED ORDER — DEXTROSE 5 % IV SOLN
3.0000 g | INTRAVENOUS | Status: DC
  Filled 2013-06-04: qty 3000

## 2013-06-04 NOTE — H&P (Signed)
[name], [name]               ACCOUNT NO.:  [id number]  MEDICAL RECORD NO.:  [id number]  LOCATION:                                FACILITY:  [facility]  PHYSICIAN:  Lovenia Kim, M.D.DATE OF BIRTH:  1975-12-09  DATE OF ADMISSION:  06/05/2013 DATE OF DISCHARGE:  06/08/2013                             HISTORY & PHYSICAL   CHIEF COMPLAINT:  Chronic hypertension, superimposed preeclampsia, now with increasingly labile hypertension for delivery with malpresentation of twin B.  HISTORY OF PRESENT ILLNESS:  She is a 38 year old African American female, G3, P0, at 36 weeks __________ for primary cesarean section.  MEDICATIONS:  Include prenatal vitamins and labetalol.  ALLERGIES:  TETRACYCLINE.  SOCIAL HISTORY:  She is a reformed smoker, nondrinker.  She denies domestic violence.  PERSONAL HISTORY:  She has a personal history of  hypertension. Pregnancy complicated by superimposed preeclampsia and gestational diabetes, which is diet controlled.  Her pregnancy is remarkable for 1 induced abortion and 1 spontaneous abortion.  PHYSICAL EXAMINATION:  GENERAL:  She is a well-developed, well-nourished [place] American female, in no acute distress. HEENT:  Normal. NECK:  Supple with full range of motion. LUNGS:  Clear to auscultation. HEART:  Regular rate and rhythm. ABDOMEN:  Soft, gravid, nontender.  No CVA tenderness.  Estimated fetal weight is within normal limits.  Most recent estimated fetal weights, which were performed approximately 2 weeks ago reveal estimated fetal weight of twin A and twin B as noted on presentation __________ to be breech. EXTREMITIES:  No cords. NEUROLOGIC:  Nonfocal. SKIN:  Intact.  IMPRESSION: 1. A 36 and 3/7th week intrauterine pregnancy. 2. Malpresentation. 3. Chronic hypertension __________ and labile hypertension __________     with primary low segment transverse     cesarean section.  Risks of anesthesia, infection, bleeding, injury     to  surrounding organs, possible need for repair discussed, delayed     versus immediate complications to include bowel and bladder injury     noted.  The patient acknowledges and wishes to proceed.     Lovenia Kim, M.D.     RJT/MEDQ  D:  06/04/2013  T:  06/04/2013  Job:  [id number]

## 2013-06-04 NOTE — H&P (Deleted)
[name], SAMMONS               ACCOUNT NO.:  [id number]  MEDICAL RECORD NO.:  [id number]  LOCATION:                                 FACILITY:  PHYSICIAN:  Lovenia Kim, M.D.DATE OF BIRTH:  Jun 24, 1975  DATE OF ADMISSION: DATE OF DISCHARGE:                             HISTORY & PHYSICAL   CHIEF COMPLAINT:  Chronic hypertension, superimposed preeclampsia, now with increasingly labile hypertension for delivery with malpresentation of twin B.  HISTORY OF PRESENT ILLNESS:  She is a 38 year old African American female, G3, P0, at 72 weeks CHTN with superimposed PEC and labile HTN for primary cesarean section.  MEDICATIONS:  Include prenatal vitamins and labetalol.  ALLERGIES:  TETRACYCLINE.  SOCIAL HISTORY:  She is a reformed smoker, nondrinker.  She denies domestic violence.  Fhx: DM  PERSONAL HISTORY:  She has a personal history of  hypertension. Pregnancy complicated by superimposed preeclampsia and gestational diabetes, which is diet controlled.    Her pregnancy history is remarkable for 1 induced abortion and 1 spontaneous abortion.  PHYSICAL EXAMINATION:  GENERAL:  She is a well-developed, well-nourished [place] American female, in no acute distress. HEENT:  Normal. NECK:  Supple with full range of motion. LUNGS:  Clear to auscultation. HEART:  Regular rate and rhythm. ABDOMEN:  Soft, gravid, nontender.  No CVA tenderness.  Estimated fetal weight is within normal limits.  Most recent estimated fetal weights on 2/10 c/w 5.4 and 5.15 pounds.  EXTREMITIES:  No cords. NEUROLOGIC:  Nonfocal. DTRs 2+, no clonus SKIN:  Intact.  IMPRESSION: 1. A 37 and 3/7th week intrauterine pregnancy. 2. Malpresentation twin B 3. Chronic hypertension with superimposed PEC and new onset labile hypertension    on Labetalol.  Proceed with primary low segment transverse     cesarean section.  Risks of anesthesia, infection, bleeding, injury     to surrounding organs, possible need  for repair discussed, delayed     versus immediate complications to include bowel and bladder injury     noted.  The patient acknowledges and wishes to proceed. Case discussed with     MFM who concurs with management plan.     Lovenia Kim, M.D.     RJT/MEDQ  D:  06/04/2013  T:  06/04/2013  Job:  [id number]

## 2013-06-04 NOTE — Patient Instructions (Signed)
[place]  06/04/2013   Your procedure is scheduled on:  06/05/13  Enter through the Main Entrance of [facility] up the phone at the desk and dial [phone].   Call this number if you have problems the morning of surgery: [phone]   Remember:   Do not eat food:After Midnight.  Do not drink clear liquids: 4 Hours before arrival.  Take these medicines the morning of surgery with A SIP OF WATER: Blood pressure medication   Do not wear jewelry, make-up or nail polish.  Do not wear lotions, powders, or perfumes. You may wear deodorant.  Do not shave 24 hours prior to surgery.  Do not bring valuables to the hospital.  [facility] is not   responsible for any belongings or valuables brought to the hospital.  Contacts, dentures or bridgework may not be worn into surgery.  Leave suitcase in the car. After surgery it may be brought to your room.  For patients admitted to the hospital, checkout time is 11:00 AM the day of              discharge.   Patients discharged the day of surgery will not be allowed to drive             home.  Name and phone number of your driver: NA  Special Instructions:      Please read over the following fact sheets that you were given:   Surgical Site Infection Prevention

## 2013-06-04 NOTE — Pre-Procedure Instructions (Signed)
BPs of 173/111 via dimamap and 172/112 manual called to [place]/Dr Ronita Hipps office. Instructions from Dr Ronita Hipps for patient to go home. Pre-eclampsia precautions reinforced. Pt instructed to go home and rest, and call with any changes in vision or headache.

## 2013-06-04 NOTE — Pre-Procedure Instructions (Signed)
Platelets of 143 reported to Dr Royce Macadamia. No order given.

## 2013-06-05 ENCOUNTER — Encounter (HOSPITAL_COMMUNITY): Payer: Self-pay | Admitting: Pharmacist

## 2013-06-05 ENCOUNTER — Encounter (HOSPITAL_COMMUNITY): Payer: 59 | Admitting: Anesthesiology

## 2013-06-05 ENCOUNTER — Inpatient Hospital Stay (HOSPITAL_COMMUNITY)
Admission: RE | Admit: 2013-06-05 | Discharge: 2013-06-08 | DRG: 765 | Disposition: A | Discharge status: Home / Self Care | Payer: 59 | Source: Ambulatory Visit | Attending: Obstetrics and Gynecology | Admitting: Obstetrics and Gynecology

## 2013-06-05 ENCOUNTER — Inpatient Hospital Stay (HOSPITAL_COMMUNITY): Payer: 59 | Admitting: Anesthesiology

## 2013-06-05 ENCOUNTER — Encounter (HOSPITAL_COMMUNITY): Payer: Self-pay | Admitting: Anesthesiology

## 2013-06-05 ENCOUNTER — Encounter (HOSPITAL_COMMUNITY)
Admission: RE | Disposition: A | Discharge status: Home / Self Care | Payer: Self-pay | Source: Ambulatory Visit | Attending: Obstetrics and Gynecology

## 2013-06-05 DIAGNOSIS — IMO0002 Reserved for concepts with insufficient information to code with codable children: Secondary | ICD-10-CM | POA: Diagnosis present

## 2013-06-05 DIAGNOSIS — I10 Essential (primary) hypertension: Secondary | ICD-10-CM | POA: Diagnosis present

## 2013-06-05 DIAGNOSIS — O329XX0 Maternal care for malpresentation of fetus, unspecified, not applicable or unspecified: Principal | ICD-10-CM

## 2013-06-05 DIAGNOSIS — O99214 Obesity complicating childbirth: Secondary | ICD-10-CM

## 2013-06-05 DIAGNOSIS — O163 Unspecified maternal hypertension, third trimester: Secondary | ICD-10-CM | POA: Diagnosis present

## 2013-06-05 DIAGNOSIS — O9903 Anemia complicating the puerperium: Secondary | ICD-10-CM | POA: Diagnosis not present

## 2013-06-05 DIAGNOSIS — E669 Obesity, unspecified: Secondary | ICD-10-CM | POA: Diagnosis present

## 2013-06-05 DIAGNOSIS — O3090 Multiple gestation, unspecified, unspecified trimester: Principal | ICD-10-CM | POA: Diagnosis present

## 2013-06-05 DIAGNOSIS — D62 Acute posthemorrhagic anemia: Secondary | ICD-10-CM | POA: Diagnosis not present

## 2013-06-05 DIAGNOSIS — O30049 Twin pregnancy, dichorionic/diamniotic, unspecified trimester: Secondary | ICD-10-CM | POA: Diagnosis present

## 2013-06-05 HISTORY — DX: Twin pregnancy, dichorionic/diamniotic, unspecified trimester: O30.049

## 2013-06-05 HISTORY — DX: Reserved for concepts with insufficient information to code with codable children: IMO0002

## 2013-06-05 LAB — HEPATIC FUNCTION PANEL
ALT: 12 U/L (ref 0–35)
AST: 25 U/L (ref 0–37)
Albumin: 2.1 g/dL — ABNORMAL LOW (ref 3.5–5.2)
Alkaline Phosphatase: 186 U/L — ABNORMAL HIGH (ref 39–117)
BILIRUBIN TOTAL: 0.4 mg/dL (ref 0.3–1.2)
Total Protein: 5.2 g/dL — ABNORMAL LOW (ref 6.0–8.3)

## 2013-06-05 LAB — GLUCOSE, CAPILLARY: Glucose-Capillary: 103 mg/dL — ABNORMAL HIGH (ref 70–99)

## 2013-06-05 LAB — RPR: RPR Ser Ql: NONREACTIVE

## 2013-06-05 SURGERY — Surgical Case
Anesthesia: Spinal | Site: Abdomen

## 2013-06-05 MED ORDER — ONDANSETRON HCL 4 MG/2ML IJ SOLN: 4.0000 mg | INTRAMUSCULAR | Status: DC | PRN

## 2013-06-05 MED ORDER — DIPHENHYDRAMINE HCL 50 MG/ML IJ SOLN: 12.5000 mg | INTRAMUSCULAR | Status: DC | PRN

## 2013-06-05 MED ORDER — MEPERIDINE HCL 25 MG/ML IJ SOLN: 6.2500 mg | INTRAMUSCULAR | Status: DC | PRN

## 2013-06-05 MED ORDER — FENTANYL CITRATE 0.05 MG/ML IJ SOLN
INTRAMUSCULAR | Status: DC | PRN
  Administered 2013-06-05: 25 ug via INTRATHECAL

## 2013-06-05 MED ORDER — LACTATED RINGERS IV SOLN
INTRAVENOUS | Status: DC | PRN
  Administered 2013-06-05 (×2): via INTRAVENOUS

## 2013-06-05 MED ORDER — DEXTROSE 5 % IV SOLN
3.0000 g | INTRAVENOUS | Status: DC | PRN
  Administered 2013-06-05: 3 g via INTRAVENOUS

## 2013-06-05 MED ORDER — FENTANYL CITRATE 0.05 MG/ML IJ SOLN: 25.0000 ug | INTRAMUSCULAR | Status: DC | PRN

## 2013-06-05 MED ORDER — LABETALOL HCL 200 MG PO TABS
400.0000 mg | ORAL_TABLET | Freq: Three times a day (TID) | ORAL | Status: DC
  Administered 2013-06-05 – 2013-06-08 (×6): 400 mg via ORAL
  Filled 2013-06-05 (×12): qty 2

## 2013-06-05 MED ORDER — ONDANSETRON HCL 4 MG PO TABS: 4.0000 mg | ORAL_TABLET | ORAL | Status: DC | PRN

## 2013-06-05 MED ORDER — NALOXONE HCL 1 MG/ML IJ SOLN
1.0000 ug/kg/h | INTRAVENOUS | Status: DC | PRN
  Filled 2013-06-05: qty 2

## 2013-06-05 MED ORDER — OXYTOCIN 10 UNIT/ML IJ SOLN
INTRAMUSCULAR | Status: AC
  Filled 2013-06-05: qty 4

## 2013-06-05 MED ORDER — BUPIVACAINE HCL (PF) 0.25 % IJ SOLN
INTRAMUSCULAR | Status: AC
  Filled 2013-06-05: qty 30

## 2013-06-05 MED ORDER — IBUPROFEN 600 MG PO TABS
600.0000 mg | ORAL_TABLET | Freq: Four times a day (QID) | ORAL | Status: DC
  Administered 2013-06-06 – 2013-06-08 (×10): 600 mg via ORAL
  Filled 2013-06-05 (×11): qty 1

## 2013-06-05 MED ORDER — PHENYLEPHRINE HCL 10 MG/ML IJ SOLN: INTRAMUSCULAR | Status: DC | PRN

## 2013-06-05 MED ORDER — SODIUM CHLORIDE 0.9 % IJ SOLN
INTRAMUSCULAR | Status: AC
  Filled 2013-06-05: qty 20

## 2013-06-05 MED ORDER — LANOLIN HYDROUS EX OINT: 1.0000 "application " | TOPICAL_OINTMENT | CUTANEOUS | Status: DC | PRN

## 2013-06-05 MED ORDER — PHENYLEPHRINE 8 MG IN D5W 100 ML (0.08MG/ML) PREMIX OPTIME
INJECTION | INTRAVENOUS | Status: AC
  Filled 2013-06-05: qty 100

## 2013-06-05 MED ORDER — SENNOSIDES-DOCUSATE SODIUM 8.6-50 MG PO TABS
2.0000 | ORAL_TABLET | ORAL | Status: DC
  Administered 2013-06-06 – 2013-06-07 (×3): 2 via ORAL
  Filled 2013-06-05 (×3): qty 2

## 2013-06-05 MED ORDER — MENTHOL 3 MG MT LOZG: 1.0000 | LOZENGE | OROMUCOSAL | Status: DC | PRN

## 2013-06-05 MED ORDER — TETANUS-DIPHTH-ACELL PERTUSSIS 5-2.5-18.5 LF-MCG/0.5 IM SUSP: 0.5000 mL | Freq: Once | INTRAMUSCULAR | Status: DC

## 2013-06-05 MED ORDER — BUPIVACAINE IN DEXTROSE 0.75-8.25 % IT SOLN
INTRATHECAL | Status: DC | PRN
  Administered 2013-06-05: 11 mg via INTRATHECAL

## 2013-06-05 MED ORDER — OXYCODONE-ACETAMINOPHEN 5-325 MG PO TABS
1.0000 | ORAL_TABLET | ORAL | Status: DC | PRN
  Administered 2013-06-06 (×2): 2 via ORAL
  Administered 2013-06-06 – 2013-06-07 (×4): 1 via ORAL
  Administered 2013-06-07: 2 via ORAL
  Administered 2013-06-08: 1 via ORAL
  Filled 2013-06-05 (×2): qty 1
  Filled 2013-06-05: qty 2
  Filled 2013-06-05 (×3): qty 1
  Filled 2013-06-05 (×2): qty 2

## 2013-06-05 MED ORDER — MORPHINE SULFATE 0.5 MG/ML IJ SOLN
INTRAMUSCULAR | Status: AC
  Filled 2013-06-05: qty 10

## 2013-06-05 MED ORDER — LACTATED RINGERS IV SOLN
Freq: Once | INTRAVENOUS | Status: AC
Start: 2013-06-05 — End: 2013-06-05
  Administered 2013-06-05: 12:00:00 via INTRAVENOUS

## 2013-06-05 MED ORDER — SODIUM CHLORIDE 0.9 % IJ SOLN: 3.0000 mL | INTRAMUSCULAR | Status: DC | PRN

## 2013-06-05 MED ORDER — DIPHENHYDRAMINE HCL 50 MG/ML IJ SOLN: 25.0000 mg | INTRAMUSCULAR | Status: DC | PRN

## 2013-06-05 MED ORDER — NALBUPHINE HCL 10 MG/ML IJ SOLN: 5.0000 mg | INTRAMUSCULAR | Status: DC | PRN

## 2013-06-05 MED ORDER — MORPHINE SULFATE (PF) 0.5 MG/ML IJ SOLN
INTRAMUSCULAR | Status: DC | PRN
  Administered 2013-06-05: .15 mg via INTRATHECAL

## 2013-06-05 MED ORDER — SIMETHICONE 80 MG PO CHEW
80.0000 mg | CHEWABLE_TABLET | ORAL | Status: DC
  Administered 2013-06-06 – 2013-06-07 (×4): 80 mg via ORAL
  Filled 2013-06-05 (×3): qty 1

## 2013-06-05 MED ORDER — DIBUCAINE 1 % RE OINT: 1.0000 "application " | TOPICAL_OINTMENT | RECTAL | Status: DC | PRN

## 2013-06-05 MED ORDER — KETOROLAC TROMETHAMINE 30 MG/ML IJ SOLN: 30.0000 mg | Freq: Four times a day (QID) | INTRAMUSCULAR | Status: AC | PRN

## 2013-06-05 MED ORDER — ONDANSETRON HCL 4 MG/2ML IJ SOLN
INTRAMUSCULAR | Status: DC | PRN
Start: 2013-06-05 — End: 2013-06-05
  Administered 2013-06-05: 4 mg via INTRAVENOUS

## 2013-06-05 MED ORDER — ONDANSETRON HCL 4 MG/2ML IJ SOLN
INTRAMUSCULAR | Status: AC
  Filled 2013-06-05: qty 2

## 2013-06-05 MED ORDER — WITCH HAZEL-GLYCERIN EX PADS: 1.0000 "application " | MEDICATED_PAD | CUTANEOUS | Status: DC | PRN

## 2013-06-05 MED ORDER — OXYTOCIN 10 UNIT/ML IJ SOLN
40.0000 [IU] | INTRAVENOUS | Status: DC | PRN
  Administered 2013-06-05: 40 [IU] via INTRAVENOUS

## 2013-06-05 MED ORDER — METOCLOPRAMIDE HCL 5 MG/ML IJ SOLN
10.0000 mg | Freq: Three times a day (TID) | INTRAMUSCULAR | Status: DC | PRN
  Administered 2013-06-05: 10 mg via INTRAVENOUS
  Filled 2013-06-05: qty 2

## 2013-06-05 MED ORDER — PHENYLEPHRINE HCL 10 MG/ML IJ SOLN
20.0000 mg | INTRAVENOUS | Status: DC | PRN
  Administered 2013-06-05: 45 ug/min via INTRAVENOUS

## 2013-06-05 MED ORDER — LABETALOL HCL 5 MG/ML IV SOLN
20.0000 mg | Freq: Once | INTRAVENOUS | Status: DC
  Filled 2013-06-05: qty 4

## 2013-06-05 MED ORDER — SIMETHICONE 80 MG PO CHEW: 80.0000 mg | CHEWABLE_TABLET | ORAL | Status: DC | PRN

## 2013-06-05 MED ORDER — SCOPOLAMINE 1 MG/3DAYS TD PT72
MEDICATED_PATCH | TRANSDERMAL | Status: AC
  Administered 2013-06-05: 1.5 mg via TRANSDERMAL
  Filled 2013-06-05: qty 1

## 2013-06-05 MED ORDER — BUPIVACAINE HCL (PF) 0.25 % IJ SOLN
INTRAMUSCULAR | Status: DC | PRN
  Administered 2013-06-05: 10 mL

## 2013-06-05 MED ORDER — SIMETHICONE 80 MG PO CHEW
80.0000 mg | CHEWABLE_TABLET | Freq: Three times a day (TID) | ORAL | Status: DC
  Administered 2013-06-06 – 2013-06-08 (×6): 80 mg via ORAL
  Filled 2013-06-05 (×8): qty 1

## 2013-06-05 MED ORDER — FENTANYL CITRATE 0.05 MG/ML IJ SOLN
INTRAMUSCULAR | Status: AC
  Filled 2013-06-05: qty 2

## 2013-06-05 MED ORDER — NALOXONE HCL 0.4 MG/ML IJ SOLN: 0.4000 mg | INTRAMUSCULAR | Status: DC | PRN

## 2013-06-05 MED ORDER — OXYTOCIN 40 UNITS IN LACTATED RINGERS INFUSION - SIMPLE MED: 62.5000 mL/h | INTRAVENOUS | Status: AC

## 2013-06-05 MED ORDER — PRENATAL MULTIVITAMIN CH
1.0000 | ORAL_TABLET | Freq: Every day | ORAL | Status: DC
  Administered 2013-06-06 – 2013-06-08 (×3): 1 via ORAL
  Filled 2013-06-05 (×3): qty 1

## 2013-06-05 MED ORDER — ZOLPIDEM TARTRATE 5 MG PO TABS: 5.0000 mg | ORAL_TABLET | Freq: Every evening | ORAL | Status: DC | PRN

## 2013-06-05 MED ORDER — LACTATED RINGERS IV SOLN
INTRAVENOUS | Status: DC
  Administered 2013-06-05 – 2013-06-06 (×2): via INTRAVENOUS

## 2013-06-05 MED ORDER — ONDANSETRON HCL 4 MG/2ML IJ SOLN: 4.0000 mg | Freq: Three times a day (TID) | INTRAMUSCULAR | Status: DC | PRN

## 2013-06-05 MED ORDER — DIPHENHYDRAMINE HCL 25 MG PO CAPS: 25.0000 mg | ORAL_CAPSULE | Freq: Four times a day (QID) | ORAL | Status: DC | PRN

## 2013-06-05 MED ORDER — SCOPOLAMINE 1 MG/3DAYS TD PT72
1.0000 | MEDICATED_PATCH | Freq: Once | TRANSDERMAL | Status: DC
  Administered 2013-06-05: 1.5 mg via TRANSDERMAL

## 2013-06-05 MED ORDER — DIPHENHYDRAMINE HCL 25 MG PO CAPS
25.0000 mg | ORAL_CAPSULE | ORAL | Status: DC | PRN
  Administered 2013-06-06: 25 mg via ORAL
  Filled 2013-06-05 (×2): qty 1

## 2013-06-05 SURGICAL SUPPLY — 33 items
CLAMP CORD UMBIL (MISCELLANEOUS) IMPLANT
CLOTH BEACON ORANGE TIMEOUT ST (SAFETY) ×2 IMPLANT
CONTAINER PREFILL 10% NBF 15ML (MISCELLANEOUS) IMPLANT
DRAPE LG THREE QUARTER DISP (DRAPES) IMPLANT
DRSG OPSITE POSTOP 4X10 (GAUZE/BANDAGES/DRESSINGS) ×2 IMPLANT
DURAPREP 26ML APPLICATOR (WOUND CARE) ×2 IMPLANT
ELECT REM PT RETURN 9FT ADLT (ELECTROSURGICAL) ×2
ELECTRODE REM PT RTRN 9FT ADLT (ELECTROSURGICAL) ×1 IMPLANT
EXTRACTOR VACUUM M CUP 4 TUBE (SUCTIONS) IMPLANT
GLOVE BIO SURGEON STRL SZ7.5 (GLOVE) ×2 IMPLANT
GOWN PREVENTION PLUS XLARGE (GOWN DISPOSABLE) ×2 IMPLANT
GOWN STRL REIN XL XLG (GOWN DISPOSABLE) ×2 IMPLANT
KIT ABG SYR 3ML LUER SLIP (SYRINGE) IMPLANT
NDL HYPO 25X1 1.5 SAFETY (NEEDLE) ×1 IMPLANT
NDL HYPO 25X5/8 SAFETYGLIDE (NEEDLE) IMPLANT
NEEDLE HYPO 25X1 1.5 SAFETY (NEEDLE) ×2 IMPLANT
NEEDLE HYPO 25X5/8 SAFETYGLIDE (NEEDLE) IMPLANT
NS IRRIG 1000ML POUR BTL (IV SOLUTION) ×2 IMPLANT
PACK C SECTION WH (CUSTOM PROCEDURE TRAY) ×2 IMPLANT
STAPLER VISISTAT 35W (STAPLE) IMPLANT
SUT MNCRL 0 VIOLET CTX 36 (SUTURE) ×2 IMPLANT
SUT MNCRL AB 3-0 PS2 27 (SUTURE) IMPLANT
SUT MON AB 2-0 CT1 27 (SUTURE) ×2 IMPLANT
SUT MON AB-0 CT1 36 (SUTURE) ×4 IMPLANT
SUT MONOCRYL 0 CTX 36 (SUTURE) ×2
SUT PLAIN 0 NONE (SUTURE) IMPLANT
SUT PLAIN 2 0 (SUTURE)
SUT PLAIN 2 0 XLH (SUTURE) IMPLANT
SUT PLAIN ABS 2-0 CT1 27XMFL (SUTURE) IMPLANT
SYR CONTROL 10ML LL (SYRINGE) ×2 IMPLANT
TOWEL OR 17X24 6PK STRL BLUE (TOWEL DISPOSABLE) ×2 IMPLANT
TRAY FOLEY CATH 14FR (SET/KITS/TRAYS/PACK) ×2 IMPLANT
WATER STERILE IRR 1000ML POUR (IV SOLUTION) ×2 IMPLANT

## 2013-06-05 NOTE — OR Nursing (Signed)
Blood pressure upon arrival to room 154/101 reported to dr. Ronita Hipps. Orders received to give patient her blood pressure medication. . Pt assymptomatic with elevated blood pressure. Darin Engels rn

## 2013-06-05 NOTE — Anesthesia Preprocedure Evaluation (Addendum)
Anesthesia Evaluation  Patient identified by MRN, date of birth, ID band Patient awake    Reviewed: Allergy & Precautions, H&P , NPO status , Patient's Chart, lab work & pertinent test results  Airway Mallampati: III TM Distance: >3 FB Neck ROM: Full    Dental no notable dental hx. (+) Teeth Intact   Pulmonary former smoker,  breath sounds clear to auscultation  Pulmonary exam normal       Cardiovascular hypertension, Pt. on medications and Pt. on home beta blockers + Valvular Problems/Murmurs Rhythm:Regular Rate:Normal  Gestational HTN   Neuro/Psych negative neurological ROS  negative psych ROS   GI/Hepatic negative GI ROS, Neg liver ROS,   Endo/Other  Morbid obesity  Renal/GU negative Renal ROS  negative genitourinary   Musculoskeletal negative musculoskeletal ROS (+)   Abdominal (+) + obese,   Peds  Hematology negative hematology ROS (+)   Anesthesia Other Findings   Reproductive/Obstetrics Twin Gestation                          Anesthesia Physical Anesthesia Plan  ASA: III  Anesthesia Plan: Spinal   Post-op Pain Management:    Induction:   Airway Management Planned: Natural Airway  Additional Equipment:   Intra-op Plan:   Post-operative Plan:   Informed Consent: I have reviewed the patients History and Physical, chart, labs and discussed the procedure including the risks, benefits and alternatives for the proposed anesthesia with the patient or authorized representative who has indicated his/her understanding and acceptance.     Plan Discussed with: Anesthesiologist, CRNA and Surgeon  Anesthesia Plan Comments:         Anesthesia Quick Evaluation

## 2013-06-05 NOTE — Lactation Note (Signed)
This note was copied from the chart of [name]. Lactation Consultation Note Request in PACU to assist with breastfeeding, twins with low BS and low temps .  Able to hand express good drops of colostrum with demonstration to mother.  In tandem, while maintaining STS,  assisted baby girl B to breastfeed intermittently, sucking with stimulation, assisting with hold entire feeding, off and on for 45 minutes.  Baby demonstrated only a few good sucks at the breast but did lick drops of colostrum.   Patient Name: [name] Date: 06/05/2013 Reason for consult: Other (Comment) (low BS, low temp)   Maternal Data Has patient been taught Hand Expression?: Yes Does the patient have breastfeeding experience prior to this delivery?: No  Feeding Feeding Type: Breast Fed Length of feed: 45 min (off and on)  LATCH Score/Interventions Latch: Repeated attempts needed to sustain latch, nipple held in mouth throughout feeding, stimulation needed to elicit sucking reflex. Intervention(s): Adjust position;Assist with latch;Breast massage;Breast compression  Audible Swallowing: A few with stimulation Intervention(s): Skin to skin;Hand expression;Alternate breast massage  Type of Nipple: Everted at rest and after stimulation  Comfort (Breast/Nipple): Soft / non-tender     Hold (Positioning): Full assist, staff holds infant at breast  [facility] Score: 6  Lactation Tools Discussed/Used     Consult Status      Vivianne Master [facility] 06/05/2013, 4:01 PM

## 2013-06-05 NOTE — Op Note (Signed)
Cesarean Section Procedure Note  Indications: malpresentation: Twin B DI DI twins. CHTN with superimposed PEC  Pre-operative Diagnosis: 36 week 3 day pregnancy.  Post-operative Diagnosis: same  Surgeon: Lovenia Kim   Assistants: Renato Battles, CNM  Anesthesia: Local anesthesia 0.25.% bupivacaine and Spinal anesthesia  ASA Class: 2  Procedure Details  The patient was seen in the Holding Room. The risks, benefits, complications, treatment options, and expected outcomes were discussed with the patient.  The patient concurred with the proposed plan, giving informed consent. The risks of anesthesia, infection, bleeding and possible injury to other organs discussed. Injury to bowel, bladder, or ureter with possible need for repair discussed. Possible need for transfusion with secondary risks of hepatitis or HIV acquisition discussed. Post operative complications to include but not limited to DVT, PE and Pneumonia noted. The site of surgery properly noted/marked. The patient was taken to Operating Room # 9, identified as Geophysicist/field seismologist and the procedure verified as C-Section Delivery. A Time Out was held and the above information confirmed.  After induction of anesthesia, the patient was draped and prepped in the usual sterile manner. A Pfannenstiel incision was made and carried down through the subcutaneous tissue to the fascia. Fascial incision was made and extended transversely using Mayo scissors. The fascia was separated from the underlying rectus tissue superiorly and inferiorly. The peritoneum was identified and entered. Peritoneal incision was extended longitudinally. The utero-vesical peritoneal reflection was incised transversely and the bladder flap was bluntly freed from the lower uterine segment. A low transverse uterine incision(Kerr hysterotomy) was made. Delivered from vertex presentation was a  Female twin A with Apgar scores of 8 at one minute and 9 at five minutes. Bulb suctioning  gently performed.Delivered from transverse lie back up presentation was a  Female twin B with Apgar scores of 8 at one minute and 9 at five minutes. Bulb suctioning gently performed. Neonatal team in attendance.After the umbilical cord was clamped and cut cord blood was obtained for evaluation. The placenta was removed intact and appeared normal. The uterus was curetted with a dry lap pack. Good hemostasis was noted.The uterine outline, tubes and ovaries appeared normal. The uterine incision was closed with running locked sutures of 0 Monocryl x 2 layers. Hemostasis was observed. Lavage was carried out until clear.The parietal peritoneum was closed with a running 2-0 Monocryl suture. The fascia was then reapproximated with running sutures of 0 Monocryl. The skin was reapproximated with 3-0 monocryl after Byers closure with 2-0 plain.  Instrument, sponge, and needle counts were correct prior the abdominal closure and at the conclusion of the case.   Findings: As above  Estimated Blood Loss:  514ml         Drains: foley                 Specimens: placentas to path                 Complications:  None; patient tolerated the procedure well.         Disposition: PACU - hemodynamically stable.         Condition: stable  Attending Attestation: I performed the procedure.

## 2013-06-05 NOTE — Transfer of Care (Signed)
Immediate Anesthesia Transfer of Care Note  Patient: [name]  Procedure(s) Performed: Procedure(s) with comments: Primary CESAREAN SECTION; Twins (N/A) - EDD: 06/30/13  Patient Location: PACU  Anesthesia Type:Spinal  Level of Consciousness: awake, alert , oriented and patient cooperative  Airway & Oxygen Therapy: Patient Spontanous Breathing  Post-op Assessment: Report given to PACU RN and Post -op Vital signs reviewed and stable  Post vital signs: Reviewed and stable  Complications: No apparent anesthesia complications

## 2013-06-05 NOTE — Anesthesia Postprocedure Evaluation (Signed)
  Anesthesia Post-op Note  Patient: [name]  Procedure(s) Performed: Procedure(s) with comments: Primary CESAREAN SECTION; Twins (N/A) - EDD: 06/30/13  Patient Location: PACU  Anesthesia Type:Spinal  Level of Consciousness: awake, alert  and oriented  Airway and Oxygen Therapy: Patient Spontanous Breathing  Post-op Pain: mild  Post-op Assessment: Post-op Vital signs reviewed, Patient's Cardiovascular Status Stable, Respiratory Function Stable, Patent Airway, No signs of Nausea or vomiting, Pain level controlled, No headache and No backache  Post-op Vital Signs: Reviewed and stable  Complications: No apparent anesthesia complications

## 2013-06-05 NOTE — Progress Notes (Signed)
Patient ID: [name], female   DOB: April 27, 1975, 38 y.o.   MRN: [id number] Patient seen and examined. Consent witnessed and signed. No changes noted. Update completed. CBC    Component Value Date/Time   WBC 7.1 06/04/2013 1450   RBC 3.84* 06/04/2013 1450   HGB 12.2 06/04/2013 1450   HCT 35.7* 06/04/2013 1450   PLT 142* 06/04/2013 1450   MCV 93.0 06/04/2013 1450   MCH 31.8 06/04/2013 1450   MCHC 34.2 06/04/2013 1450   RDW 13.6 06/04/2013 1450

## 2013-06-05 NOTE — Anesthesia Postprocedure Evaluation (Deleted)
  Anesthesia Post-op Note  Patient: [name]  Procedure(s) Performed: Procedure(s) with comments: Primary CESAREAN SECTION; Twins (N/A) - EDD: 06/30/13  Patient Location: PACU  Anesthesia Type:Spinal  Level of Consciousness: awake, alert , oriented and patient cooperative  Airway and Oxygen Therapy: Patient Spontanous Breathing  Post-op Pain: none  Post-op Assessment: Post-op Vital signs reviewed, Patient's Cardiovascular Status Stable and Respiratory Function Stable  Post-op Vital Signs: Reviewed and stable  Complications: No apparent anesthesia complications

## 2013-06-05 NOTE — Anesthesia Procedure Notes (Signed)
Spinal  Patient location during procedure: OR Start time: 06/05/2013 1:12 PM Staffing Anesthesiologist: Delayna Sparlin A. Performed by: anesthesiologist  Preanesthetic Checklist Completed: patient identified, site marked, surgical consent, pre-op evaluation, timeout performed, IV checked, risks and benefits discussed and monitors and equipment checked Spinal Block Patient position: sitting Prep: site prepped and draped and DuraPrep Patient monitoring: heart rate, cardiac monitor, continuous pulse ox and blood pressure Approach: midline Location: L3-4 Injection technique: single-shot Needle Needle type: Sprotte  Needle gauge: 24 G Needle length: 9 cm Needle insertion depth: 6 cm Assessment Sensory level: T4 Additional Notes Patient tolerated procedure well. Adequate sensory level.

## 2013-06-05 NOTE — Lactation Note (Signed)
This note was copied from the chart of [place]. Lactation Consultation Note  Patient Name: [name] [place] [id number] Date: 06/05/2013 Reason for consult: Initial assessment Baby 2 hours old. Mom in PACU. Called to assist mom with latch. Baby latches well with assist. Baby nursed for two 20 minute periods with strong rhythmic sucking and swallows. Able to express some colostrum, but baby able to obtain more while nursing as evidenced around baby's mouth. Mom tolerated assistance well and stated she was comfortable throughout latching and nursing assistance.   Maternal Data Infant to breast within first hour of birth: Yes Has patient been taught Hand Expression?: No (Mom has twins, assisted in PACU.) Does the patient have breastfeeding experience prior to this delivery?: No  Feeding Feeding Type: Breast Fed  LATCH Score/Interventions Latch: Repeated attempts needed to sustain latch, nipple held in mouth throughout feeding, stimulation needed to elicit sucking reflex.  Audible Swallowing: Spontaneous and intermittent  Type of Nipple: Everted at rest and after stimulation  Comfort (Breast/Nipple): Soft / non-tender     Hold (Positioning): Full assist, staff holds infant at breast  LATCH Score: 7  Lactation Tools Discussed/Used     Consult Status Consult Status: Follow-up Date: 06/06/13 Follow-up type: In-patient    Inocente Salles 06/05/2013, 4:01 PM

## 2013-06-06 LAB — COMPREHENSIVE METABOLIC PANEL
ALK PHOS: 149 U/L — AB (ref 39–117)
ALT: 13 U/L (ref 0–35)
AST: 31 U/L (ref 0–37)
Albumin: 1.8 g/dL — ABNORMAL LOW (ref 3.5–5.2)
BUN: 18 mg/dL (ref 6–23)
CO2: 22 meq/L (ref 19–32)
Calcium: 8.1 mg/dL — ABNORMAL LOW (ref 8.4–10.5)
Chloride: 99 mEq/L (ref 96–112)
Creatinine, Ser: 1.33 mg/dL — ABNORMAL HIGH (ref 0.50–1.10)
GFR calc non Af Amer: 51 mL/min — ABNORMAL LOW (ref >90)
GFR, EST AFRICAN AMERICAN: 59 mL/min — AB (ref >90)
GLUCOSE: 102 mg/dL — AB (ref 70–99)
POTASSIUM: 4.8 meq/L (ref 3.7–5.3)
SODIUM: 131 meq/L — AB (ref 137–147)
Total Bilirubin: <0.2 mg/dL — ABNORMAL LOW (ref 0.3–1.2)
Total Protein: 4.9 g/dL — ABNORMAL LOW (ref 6.0–8.3)

## 2013-06-06 LAB — CBC
HCT: 21.5 % — ABNORMAL LOW (ref 36.0–46.0)
HEMOGLOBIN: 7.3 g/dL — AB (ref 12.0–15.0)
MCH: 31.9 pg (ref 26.0–34.0)
MCHC: 34 g/dL (ref 30.0–36.0)
MCV: 93.9 fL (ref 78.0–100.0)
Platelets: 103 10*3/uL — ABNORMAL LOW (ref 150–400)
RBC: 2.29 MIL/uL — AB (ref 3.87–5.11)
RDW: 13.8 % (ref 11.5–15.5)
WBC: 10.9 10*3/uL — ABNORMAL HIGH (ref 4.0–10.5)

## 2013-06-06 LAB — GLUCOSE, CAPILLARY: Glucose-Capillary: 126 mg/dL — ABNORMAL HIGH (ref 70–99)

## 2013-06-06 MED ORDER — POLYSACCHARIDE IRON COMPLEX 150 MG PO CAPS
150.0000 mg | ORAL_CAPSULE | Freq: Two times a day (BID) | ORAL | Status: DC
  Administered 2013-06-06 – 2013-06-08 (×5): 150 mg via ORAL
  Filled 2013-06-06 (×7): qty 1

## 2013-06-06 NOTE — Progress Notes (Signed)
Patient ID: [name], female   DOB: October 17, 1975, 38 y.o.   MRN: [id number] Subjective: POD# 1 Information for the patient's newborn:  Laketa, Sandoz [[id number]]  female Information for the patient's newborn:  Tunisha, Ruland [[id number]]  female  Reports feeling sore and tired, but well Feeding: breast and bottle - twin A latches well, twin B does latch well, cup feedings Patient reports tolerating PO.  Breast symptoms: none Pain controlled with ibuprofen (OTC) and narcotic analgesics including Percocet Denies HA/SOB/C/P/N/V/dizziness. Flatus none. She reports vaginal bleeding as moderate, without clots since middle of night.  She is ambulating, urinating without difficult.     Objective:   VS:  Filed Vitals:   06/06/13 0146 06/06/13 0148 06/06/13 0415 06/06/13 0532  BP: 111/75 95/66 118/73 118/96  Pulse: 93 101 69 68  Temp:   97.1 F (36.2 C) 98 F (36.7 C)  TempSrc:   Oral Oral  Resp: 20 20 20 20   Height:      Weight:      SpO2: 100% 100% 98% 98%     Intake/Output Summary (Last 24 hours) at 06/06/13 0903 Last data filed at 06/06/13 0420  Gross per 24 hour  Intake   3860 ml  Output   1500 ml  Net   2360 ml        Recent Labs  06/04/13 1450 06/06/13 0615  WBC 7.1 10.9*  HGB 12.2 7.3*  HCT 35.7* 21.5*  PLT 142* 103*     Blood type: --/--/A POS, A POS (02/19 1450)  Rubella: Immune (08/19 1013)     Physical Exam:  General: alert, cooperative, no distress and moderately obese CV: Regular rate and rhythm, S1S2 present or without murmur or extra heart sounds Resp: clear Abdomen: soft, nontender, normal bowel sounds Incision: occlusive pressure dressing in place; some blood soiling on dressing from peripad Uterine Fundus: firm, 2-3 FB above umbilicus, nontender Lochia: moderate Ext: extremities normal, atraumatic, no cyanosis and Homans sign is negative, no sign of DVT   Assessment/Plan: 38 y.o.   POD# 1.  s/p Cesarean Delivery.  Indications:  twins, CHTN with mild PEC                Postoperative ABL Anemia  Doing well, stable.               Regular diet as tolerated Ambulate Asymptomatic from ABL Anemia - monitor for s/s Closely monitor peripads for excessive bleeding Routine post-op care  *Dr. Ronita Hipps notified of assessment and plans - agrees  [name], MSN, CNM 06/06/2013, 7:30 AM

## 2013-06-06 NOTE — Lactation Note (Signed)
This note was copied from the chart of [place]. Lactation Consultation Note   Follow up consult with this mom opf twnins, now 16 hours old, and 36 4/7 weeks corrected gestation. Baby A weighs 5 -14 .9 today. Very sleepy, no latch. The baby fed 5 mos from bottle and tolerated well. I started mom pumping with DEP every 3 hours, for 15 minutes in premie setting. Mom will hand express after pumping, and feed what she expressed to the babies fiirst, and then formula feed, by bottle, every 3 hours. The babies were being sup fed, but only taking 1-46mls. I gave mom the [facility] Feeding amounts chart to follow. Mom knows to call for questions/concenrs.  Patient Name: [name] Mundorf GEZMO'Q Date: 06/06/2013 Reason for consult: Follow-up assessment;Infant < 6lbs;Multiple gestation   Maternal Data    Feeding Feeding Type: Breast Milk  LATCH Score/Interventions Latch: Too sleepy or reluctant, no latch achieved, no sucking elicited.                    Lactation Tools Discussed/Used Tools: Pump Pump Review: Setup, frequency, and cleaning;Milk Storage;Other (comment) (premie setting, part care) Initiated by:: c Genesee Nase RN [place] Date initiated:: 06/06/13   Consult Status Consult Status: Follow-up Date: 06/07/13 Follow-up type: In-patient    Tonna Corner 06/06/2013, 9:47 AM

## 2013-06-06 NOTE — Addendum Note (Signed)
Addendum created 06/06/13 0051 by Ignacia Bayley, CRNA   Modules edited: Notes Section   Notes Section:  File: [id number]

## 2013-06-06 NOTE — Lactation Note (Signed)
This note was copied from the chart of [place]. Lactation Consultation Note   Follow up consult with this mom of twins. See BAby A's note from same time. Baby B fed 6 mls of formula from bottle, and tolerated well.  Patient Name: [name] Date: 06/06/2013 Reason for consult: Follow-up assessment;Infant < 6lbs;Late preterm infant;Multiple gestation   Maternal Data    Feeding Feeding Type: Bottle Fed - Formula  LATCH Score/Interventions                      Lactation Tools Discussed/Used Pump Review: Setup, frequency, and cleaning;Milk Storage Date initiated:: 06/06/13   Consult Status Consult Status: Follow-up Follow-up type: In-patient    Tonna Corner 06/06/2013, 9:52 AM

## 2013-06-06 NOTE — Anesthesia Postprocedure Evaluation (Signed)
  Anesthesia Post-op Note  Patient: [name]  Procedure(s) Performed: Procedure(s) with comments: Primary CESAREAN SECTION; Twins (N/A) - EDD: 06/30/13  Patient Location: Mother/Baby  Anesthesia Type:Spinal  Level of Consciousness: awake  Airway and Oxygen Therapy: Patient Spontanous Breathing  Post-op Pain: mild  Post-op Assessment: Patient's Cardiovascular Status Stable and Respiratory Function Stable  Post-op Vital Signs: stable  Complications: No apparent anesthesia complications

## 2013-06-07 ENCOUNTER — Encounter (HOSPITAL_COMMUNITY): Payer: Self-pay | Admitting: Obstetrics and Gynecology

## 2013-06-07 DIAGNOSIS — O30049 Twin pregnancy, dichorionic/diamniotic, unspecified trimester: Secondary | ICD-10-CM

## 2013-06-07 DIAGNOSIS — IMO0002 Reserved for concepts with insufficient information to code with codable children: Secondary | ICD-10-CM

## 2013-06-07 HISTORY — DX: Reserved for concepts with insufficient information to code with codable children: IMO0002

## 2013-06-07 HISTORY — DX: Twin pregnancy, dichorionic/diamniotic, unspecified trimester: O30.049

## 2013-06-07 LAB — CBC
HEMATOCRIT: 20.9 % — AB (ref 36.0–46.0)
Hemoglobin: 7.1 g/dL — ABNORMAL LOW (ref 12.0–15.0)
MCH: 32 pg (ref 26.0–34.0)
MCHC: 34 g/dL (ref 30.0–36.0)
MCV: 94.1 fL (ref 78.0–100.0)
Platelets: 122 10*3/uL — ABNORMAL LOW (ref 150–400)
RBC: 2.22 MIL/uL — ABNORMAL LOW (ref 3.87–5.11)
RDW: 14.3 % (ref 11.5–15.5)
WBC: 12.1 10*3/uL — AB (ref 4.0–10.5)

## 2013-06-07 NOTE — Lactation Note (Signed)
This note was copied from the chart of [place]. Lactation Consultation Note Follow up visit at 57 hours of age.  Mom is bottle feeding formula to [redacted]w[redacted]d twins.  Mom has DEBP, and has only pumped once today, she is discouraged she didn't get milk.  Discussed importance of regular pumping and supporting milk supply.  Mom does not appear to be motivated to follow plan to pump.  Offered support.  Encouraged STS for both babies and mom.    Patient Name: [name]'O Date: 06/07/2013 Reason for consult: Follow-up assessment   Maternal Data    Feeding    LATCH Score/Interventions                Intervention(s): Breastfeeding basics reviewed     Lactation Tools Discussed/Used     Consult Status Consult Status: PRN    Justice Britain 06/07/2013, 10:27 PM

## 2013-06-07 NOTE — Progress Notes (Addendum)
Patient ID: [name], female   DOB: October 10, 1975, 38 y.o.   MRN: [id number] Subjective: POD# 2 Information for the patient's newborn:  Melanie, Hutchinson [[id number]]  female Information for the patient's newborn:  Melanie, Hutchinson [[id number]]  female   Reports feeling sore and tired, but well Feeding: breast and bottle - twin A latches well, twin B does latch well - formula fed Patient reports tolerating PO.  Breast symptoms: none Pain controlled with ibuprofen (OTC) and narcotic analgesics including Percocet Denies HA/SOB/C/P/N/V/dizziness. Flatus present. She reports vaginal bleeding as moderate, without clots.  She is ambulating, urinating without difficult.     Objective:   VS:  Filed Vitals:   06/06/13 1632 06/06/13 1752 06/06/13 2136 06/07/13 0621  BP: 142/85 133/84 148/84 123/80  Pulse: 85 79 80 81  Temp:  98 F (36.7 C)  97.7 F (36.5 C)  TempSrc:  Oral  Oral  Resp:  18  18  Height:      Weight:      SpO2:          Intake/Output Summary (Last 24 hours) at 06/07/13 0920 Last data filed at 06/06/13 1345  Gross per 24 hour  Intake    480 ml  Output    800 ml  Net   -320 ml         Recent Labs  06/04/13 1450 06/06/13 0615  WBC 7.1 10.9*  HGB 12.2 7.3*  HCT 35.7* 21.5*  PLT 142* 103*     Blood type: --/--/A POS, A POS (02/19 1450)  Rubella: Immune (08/19 1013)     Physical Exam:  General: alert, cooperative, no distress and moderately obese CV: Regular rate and rhythm, S1S2 present or without murmur or extra heart sounds Resp: clear Abdomen: soft, nontender, normal bowel sounds Incision: occlusive pressure dressing in place; some blood soiling on dressing from peripad Uterine Fundus: firm, 1 FB below umbilicus, nontender Lochia: moderate Ext: extremities normal, atraumatic, no cyanosis and Homans sign is negative, no sign of DVT   Assessment/Plan: 38 y.o.   POD# 2.  s/p Cesarean Delivery.  Indications: twins, CHTN with mild PEC                 Postoperative ABL Anemia  Doing well, stable.               Regular diet as tolerated Ambulate CBC this AM Shower & remove pressure dressing Asymptomatic from ABL Anemia - monitor for s/s Routine post-op care Anticipate discharge tomorrow AM  -Needs BP check and repeat CBC in office 1 week after d/c home  *Dr. Ronita Hipps notified of assessment and plan  Melanie Congress, MSN, CNM 06/07/2013, 9:20 AM

## 2013-06-08 ENCOUNTER — Encounter (HOSPITAL_COMMUNITY): Payer: Self-pay | Admitting: Obstetrics and Gynecology

## 2013-06-08 MED ORDER — SENNOSIDES-DOCUSATE SODIUM 8.6-50 MG PO TABS: 2.0000 | ORAL_TABLET | Freq: Every evening | ORAL | Status: DC | PRN

## 2013-06-08 MED ORDER — OXYCODONE-ACETAMINOPHEN 5-325 MG PO TABS: 1.0000 | ORAL_TABLET | ORAL | Status: DC | PRN

## 2013-06-08 MED ORDER — IBUPROFEN 600 MG PO TABS: 600.0000 mg | ORAL_TABLET | Freq: Four times a day (QID) | ORAL | Status: DC | PRN

## 2013-06-08 MED ORDER — POLYSACCHARIDE IRON COMPLEX 150 MG PO CAPS: 150.0000 mg | ORAL_CAPSULE | Freq: Two times a day (BID) | ORAL | Status: DC

## 2013-06-08 NOTE — Progress Notes (Addendum)
POD # 3  Subjective: Pt reports feeling well/ Pain controlled with prescription NSAID's including ibuprofen (Motrin) and narcotic analgesics including Percocet Tolerating po/Voiding without problems/ No n/v/Flatus pos Activity: out of bed and ambulate Bleeding is light Newborn info:  Information for the patient's newborn:  Lachlyn, Vanderstelt [[id number]]  female Information for the patient's newborn:  Landi, Biscardi [[id number]]  female  Feeding: breast and bottle   Objective: VS: Blood pressure 129/74, pulse 78, temperature 98.3 F (36.8 C), temperature source Oral, resp. rate 20, height 5\' 6"  (1.676 m), weight 270 lb (122.471 kg), SpO2 100.00%, unknown if currently breastfeeding.   LABS:  Recent Labs  06/06/13 0615 06/07/13 0910  WBC 10.9* 12.1*  HGB 7.3* 7.1*  PLT 103* 122*                             Physical Exam:  General: alert and no distress CV: Regular rate and rhythm Resp: clear Abdomen: soft, BS hypoactive Incision: healing well Uterine Fundus: firm, below umbilicus, nontender Lochia: minimal Ext: edema +3 pitting and Homans sign is negative, no sign of DVT    A/P: POD # 3/ G3P0122/ S/P C/Section d/t Twins and CHTN ABL Anemia  Doing well and stable for discharge home RX's: Ibuprofen 600mg  po Q 6 hrs prn pain #30 Refill x 1 Percocet 5/325 1 - 2 tabs po every 6 hrs prn pain  #30 No refill Niferex 150mg  po QD/BID #30/#60 Refill x 1 Colace 100mg  po up to TID prn #30 Ref x 1 Labetalol 400mg  po TID   Will come to office in 1 week for BP check and rpt cbc     Signed: Janifer Adie, DUSON NP Student, RN 06/08/2013, 9:40 AM   I agree with about documentation and plan of care. Patient care and documentation supervised by Gustavo Lah, [facility], MSN

## 2013-06-08 NOTE — Clinical Social Work Maternal (Signed)
Clinical Social Work Department PSYCHOSOCIAL ASSESSMENT - MATERNAL/CHILD 06/08/2013  Patient:  [name], [name]  Account Number:  [id number]  Admit Date:  06/05/2013  Ardine Eng Name:   Elenor Legato Ausley    Clinical Social Worker:  Gerri Spore, LCSW   Date/Time:  06/08/2013 01:04 PM  Date Referred:  06/08/2013   Referral source  RN     Referred reason  Other - See comment   Other referral source:    I:  FAMILY / HOME ENVIRONMENT Child's legal guardian:  PARENT  Guardian - Name Guardian - Age Guardian - Address  Donalee Armato [address] [place]; [place], [place]  (Not disclosed)     Other household support members/support persons Other support:   Pts mother & sister    II  PSYCHOSOCIAL DATA Information Source:  Patient Interview  Insurance risk surveyor Resources Employment:   Solicitor resources:  Multimedia programmer If [place]:    School / Grade:   Maternity Care Coordinator / Child Services Coordination / Early Interventions:  Cultural issues impacting care:    III  STRENGTHS Strengths  Adequate Resources  Home prepared for Child (including basic supplies)  Supportive family/friends   Strength comment:    IV  RISK FACTORS AND CURRENT PROBLEMS Current Problem:  YES   Risk Factor & Current Problem Patient Issue Family Issue Risk Factor / Current Problem Comment  Other - See comment Y N RN request    V  SOCIAL WORK ASSESSMENT CSW met with pt to assess for resources & support in the home.  Pt is employed full time at [employer].  She lives alone however reports that her mother lives close by. She also identified a sister as a support person, who lives 45 minutes away.  Pt does not wish to disclosed FOB identity at this time.  Pt seems to be bonding well & very attentive to the twins during assessment.  She denies any history of depression or SI.  CSW discussed PP depression & encouraged her to seek medial  attention if needed.  Pt seemed receptive to information discussed & seems happy about becoming a mother.      VI SOCIAL WORK PLAN Social Work Plan  No Further Intervention Required / No Barriers to Discharge   Type of pt/family education:   If child protective services report - county:   If child protective services report - date:   Information/referral to community resources comment:   Other social work plan:

## 2013-06-08 NOTE — Discharge Summary (Signed)
Obstetric Discharge Summary Reason for Admission: G3 0 0 2 0 @ 36wks, Kathi Der Twin gestation, for primary C/S d/t malpresentation of Twin B and hx chronic hypertension, superimposed with PEC w/recent onset of labile BP's. Prenatal Procedures: NST and ultrasound Intrapartum Procedures: cesarean: low cervical, transverse Postpartum Procedures: none Complications-Operative and Postpartum: none Hemoglobin  Date Value Ref Range Status  06/07/2013 7.1* 12.0 - 15.0 g/dL Final     HCT  Date Value Ref Range Status  06/07/2013 20.9* 36.0 - 46.0 % Final    Physical Exam:  General: alert, cooperative and no distress Lochia: appropriate Uterine Fundus: firm Incision: well approximated and closed with sutures, covered with tegaderm and honeycomb dressing DVT Evaluation: No evidence of DVT seen on physical exam. Negative Homan's sign. Calf/Ankle edema is present, +3 pedal and pretib edema.  Discharge Diagnoses: G3 P0 2 2 2  s/p primary C/S @ 36wks d/t twins and chronic HTN.  ABL Anemia  Discharge Information: Date: 06/08/2013 Activity: pelvic rest Diet: routine Medications: PNV, Ibuprofen, Colace, Iron and Percocet Condition: stable Instructions: refer to practice specific booklet Discharge to: home Follow-up Information   Follow up with Lovenia Kim, MD In 6 weeks. (Also return in 1 week for BP check and repeat CBC)    Specialty:  Obstetrics and Gynecology   Contact information:   [address] [place] [address] [phone]       Newborn Data:   Kaelen, Brennan [[id number]]  Live born female  Birth Weight: 6 lb 0.1 oz (2725 g) APGAR: 8, 9   Neviah, Braud [[id number]]  Live born female  Birth Weight: 5 lb 7.5 oz (2480 g) APGAR: 8, 9  Home with mother.  Glenna Brunkow K 06/08/2013, 9:56 AM

## 2013-10-11 ENCOUNTER — Encounter (HOSPITAL_COMMUNITY): Payer: Self-pay | Admitting: Emergency Medicine

## 2013-10-11 ENCOUNTER — Emergency Department (INDEPENDENT_AMBULATORY_CARE_PROVIDER_SITE_OTHER)
Admission: EM | Admit: 2013-10-11 | Discharge: 2013-10-11 | Disposition: A | Discharge status: Home / Self Care | Payer: 59 | Source: Home / Self Care | Attending: Emergency Medicine | Admitting: Emergency Medicine

## 2013-10-11 DIAGNOSIS — L0390 Cellulitis, unspecified: Principal | ICD-10-CM

## 2013-10-11 DIAGNOSIS — L0291 Cutaneous abscess, unspecified: Secondary | ICD-10-CM | POA: Diagnosis present

## 2013-10-11 MED ORDER — HYDROCODONE-ACETAMINOPHEN 5-325 MG PO TABS: 1.0000 | ORAL_TABLET | Freq: Four times a day (QID) | ORAL | Status: DC | PRN

## 2013-10-11 MED ORDER — DOXYCYCLINE HYCLATE 100 MG PO TABS: 100.0000 mg | ORAL_TABLET | Freq: Two times a day (BID) | ORAL | Status: DC

## 2013-10-11 NOTE — ED Notes (Signed)
Patient c/o boil in left axilla onset 3 days ago. Patient reports she has been using a hot and cold compress with no relief. Patient reports she took 800 mg of Ibuprofen with mild relief. Patient is alert and oriented and in no acute distress.

## 2013-10-11 NOTE — ED Provider Notes (Signed)
CSN: [id number]     Arrival date & time 10/11/13  3545 History   First MD Initiated Contact with Patient 10/11/13 1103     Chief Complaint  Patient presents with  . Recurrent Skin Infections   (Consider location/radiation/quality/duration/timing/severity/associated sxs/prior Treatment) HPI  Skin infection - left axilla, 5 days duration, very painful with swelling, no drainage, no fever or chills, no treatments tried; pt is not breastfeeding  PMH - left axillary abscess with I&D multiple times   Past Medical History  Diagnosis Date  . Hypertension     no meds now  . Heart murmur     as infant  . Postpartum care following cesarean delivery - twins (2/20) 06/07/2013  . Mild or unspecified pre-eclampsia, with delivery 06/07/2013  . Dichorionic diamniotic twin gestation 06/07/2013   Past Surgical History  Procedure Laterality Date  . Reconstructive surgery  1976-11-30 &1992  . Partial mastectomy with needle localization  03/05/2012    Procedure: PARTIAL MASTECTOMY WITH NEEDLE LOCALIZATION;  Surgeon: Marcello Moores A. Cornett, MD;  Location: [facility];  Service: General;  Laterality: Right;  . Cesarean section N/A 06/05/2013    Procedure: Primary CESAREAN SECTION; Twins;  Surgeon: Lovenia Kim, MD;  Location: [facility];  Service: Obstetrics;  Laterality: N/A;  EDD: 06/30/13   Family History  Problem Relation Age of Onset  . Cancer Mother     lymphoma   History  Substance Use Topics  . Smoking status: Former Smoker -- 0.50 packs/day    Types: Cigarettes    Quit date: 10/03/2012  . Smokeless tobacco: Never Used  . Alcohol Use: No     Comment: social   OB History   Grav Para Term Preterm Abortions TAB SAB Ect Mult Living   3 1  1 2 1 1  1 2      Review of Systems Positive for tenderness and swelling of left axilla Negative for fever, chills, nausea, vomiting, diarrhea Allergies  Review of patient's allergies indicates no known allergies.  Home Medications   Prior to  Admission medications   Medication Sig Start Date End Date Taking? Authorizing Provider  doxycycline (VIBRA-TABS) 100 MG tablet Take 1 tablet (100 mg total) by mouth 2 (two) times daily. Take for 10 days. 10/11/13   Angelica Ran, MD  HYDROcodone-acetaminophen (NORCO) 5-325 MG per tablet Take 1 tablet by mouth every 6 (six) hours as needed for moderate pain. 10/11/13   Angelica Ran, MD  ibuprofen (ADVIL,MOTRIN) 600 MG tablet Take 1 tablet (600 mg total) by mouth every 6 (six) hours as needed. 06/08/13   Darleen Crocker, NP  iron polysaccharides (NIFEREX) 150 MG capsule Take 1 capsule (150 mg total) by mouth 2 (two) times daily. 06/08/13   Darleen Crocker, NP  labetalol (NORMODYNE) 200 MG tablet Take 400 mg by mouth 3 (three) times daily.     Historical Provider, MD  Prenatal Vit-Fe Fumarate-FA (MULTIVITAMIN-PRENATAL) 27-0.8 MG TABS tablet Take 1 tablet by mouth daily at 12 noon.    Historical Provider, MD  senna-docusate (SENOKOT-S) 8.6-50 MG per tablet Take 2 tablets by mouth at bedtime as needed for mild constipation. 06/08/13   Darleen Crocker, NP   BP 153/95  Pulse 77  Temp(Src) 100.2 F (37.9 C) (Oral)  Resp 20  SpO2 99%  LMP 10/11/2013 Physical Exam Gen: obese AAF, pleasant, uncomfortable appearing  Skin: large abscess of left axilla with fluctuance and surround cellulitis LUE: no edema or lymphadenopathy of arm  ED Course  Procedures (including critical care time) Labs Review Labs Reviewed - No data to display  Imaging Review No results found.   MDM   1. Abscess and cellulitis    Suspected MRSA. I&D, PO doxycycline.   Procedure Note:   Procedure: Incision and drainage Location: left axilla Consent obtained: yes, verba' Procedure: Area cleaned and prepped with betadine solution. Anesthetized with 10 mL of 2% lidocaine with epinephrine. Incised with No. 11 blade scalpel. Findings included purulent drainage. The abscess was packed with iodoform packing. Estimated  blood loss 25 ml. Patient tolerated procedure well and was given instructions for after care.    Angelica Ran, MD 10/11/13 1210

## 2013-10-11 NOTE — Discharge Instructions (Signed)
Ms. Prarthana, [place] to meet you. Please start the antibiotics today and complete the 10 day course. Also, use the pain medication as needed.  Also, please follow up in 2 days to make sure it is getting better. Please return sooner if you are getting worse with pain or fever.   Sincerely,   Dr. Maricela Bo

## 2013-10-13 ENCOUNTER — Emergency Department (INDEPENDENT_AMBULATORY_CARE_PROVIDER_SITE_OTHER)
Admission: EM | Admit: 2013-10-13 | Discharge: 2013-10-13 | Disposition: A | Discharge status: Home / Self Care | Payer: 59 | Source: Home / Self Care | Attending: Family Medicine | Admitting: Family Medicine

## 2013-10-13 ENCOUNTER — Encounter (HOSPITAL_COMMUNITY): Payer: Self-pay | Admitting: Emergency Medicine

## 2013-10-13 DIAGNOSIS — Z4800 Encounter for change or removal of nonsurgical wound dressing: Secondary | ICD-10-CM

## 2013-10-13 DIAGNOSIS — L02412 Cutaneous abscess of left axilla: Secondary | ICD-10-CM

## 2013-10-13 NOTE — Discharge Instructions (Signed)
Thank you for coming in today. Come back as needed

## 2013-10-13 NOTE — ED Notes (Signed)
Pt here for follow up/packing removal from left axillary.  States feels a little bit better.  Pain 6/10.

## 2013-10-13 NOTE — ED Provider Notes (Signed)
[name] is a 38 y.o. female who presents to Urgent Care today for followup left axillary abscess. Patient was seen 3 days prior for abscess and had incision and drainage and packing. She feels as a the pain is improving significantly. She denies any fevers or chills.   Past Medical History  Diagnosis Date  . Hypertension     no meds now  . Heart murmur     as infant  . Postpartum care following cesarean delivery - twins (2/20) 06/07/2013  . Mild or unspecified pre-eclampsia, with delivery 06/07/2013  . Dichorionic diamniotic twin gestation 06/07/2013   History  Substance Use Topics  . Smoking status: Former Smoker -- 0.50 packs/day    Types: Cigarettes    Quit date: 10/03/2012  . Smokeless tobacco: Never Used  . Alcohol Use: No     Comment: social   ROS as above Medications: No current facility-administered medications for this encounter.   Current Outpatient Prescriptions  Medication Sig Dispense Refill  . doxycycline (VIBRA-TABS) 100 MG tablet Take 1 tablet (100 mg total) by mouth 2 (two) times daily. Take for 10 days.  20 tablet  0  . HYDROcodone-acetaminophen (NORCO) 5-325 MG per tablet Take 1 tablet by mouth every 6 (six) hours as needed for moderate pain.  20 tablet  0  . ibuprofen (ADVIL,MOTRIN) 600 MG tablet Take 1 tablet (600 mg total) by mouth every 6 (six) hours as needed.  30 tablet  1  . iron polysaccharides (NIFEREX) 150 MG capsule Take 1 capsule (150 mg total) by mouth 2 (two) times daily.  60 capsule  1  . labetalol (NORMODYNE) 200 MG tablet Take 400 mg by mouth 3 (three) times daily.       . Prenatal Vit-Fe Fumarate-FA (MULTIVITAMIN-PRENATAL) 27-0.8 MG TABS tablet Take 1 tablet by mouth daily at 12 noon.      . senna-docusate (SENOKOT-S) 8.6-50 MG per tablet Take 2 tablets by mouth at bedtime as needed for mild constipation.  60 tablet  1    Exam:  BP 140/79  Pulse 72  Temp(Src) 98.1 F (36.7 C) (Oral)  Resp 16  SpO2 100%  LMP 10/11/2013 Gen: Well  NAD Skin: Left axillary area: Well appearing abscess with packing. Packing was removed. Nontender skin.  Assessment and Plan: 38 y.o. female with resolving left axillary abscess. Continue antibiotics. Followup as needed.  Discussed warning signs or symptoms. Please see discharge instructions. Patient expresses understanding.    Gregor Hams, MD 10/13/13 2051

## 2013-10-14 LAB — WOUND CULTURE: GRAM STAIN: NONE SEEN

## 2013-10-14 NOTE — ED Provider Notes (Signed)
Medical screening examination/treatment/procedure(s) were performed by a resident physician and as supervising physician I was immediately available for consultation/collaboration.  Philipp Deputy, M.D.  Harden Mo, MD 10/14/13 [phone]

## 2013-10-14 NOTE — ED Notes (Signed)
Wound culture L arm: Rare Staph. Aureus.  Pt. adequately treated with I and D and Doxycycline. [name] 10/14/2013

## 2014-02-15 ENCOUNTER — Encounter (HOSPITAL_COMMUNITY): Payer: Self-pay | Admitting: Emergency Medicine

## 2014-08-02 IMAGING — CR DG CHEST 2V
2 series · 2 of 2 positions shown · non-contrast
Comparison: None.

CLINICAL DATA: Preop for right breast surgery, smoking history

CHEST - 2 VIEW

[w chest pa]
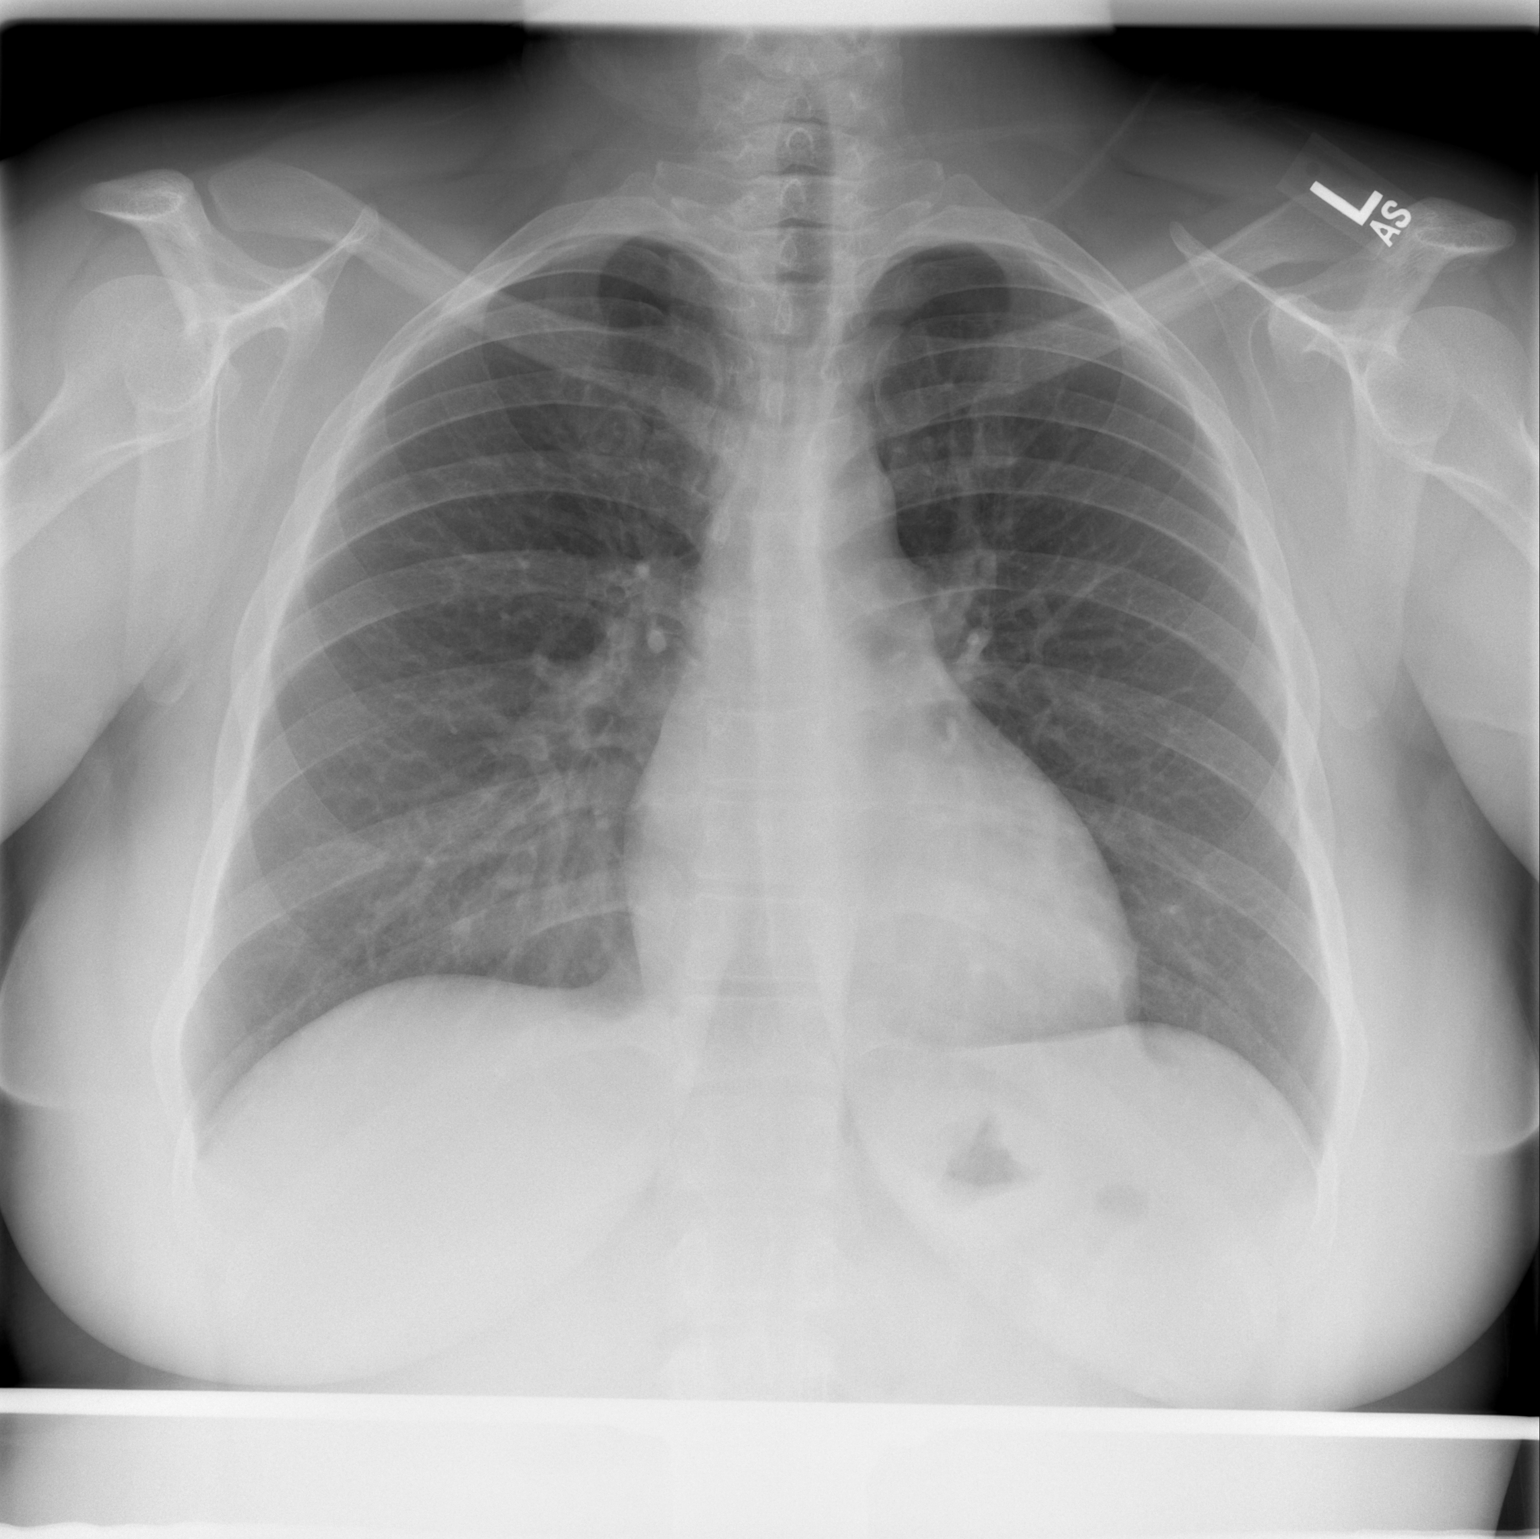

[w chest lat]
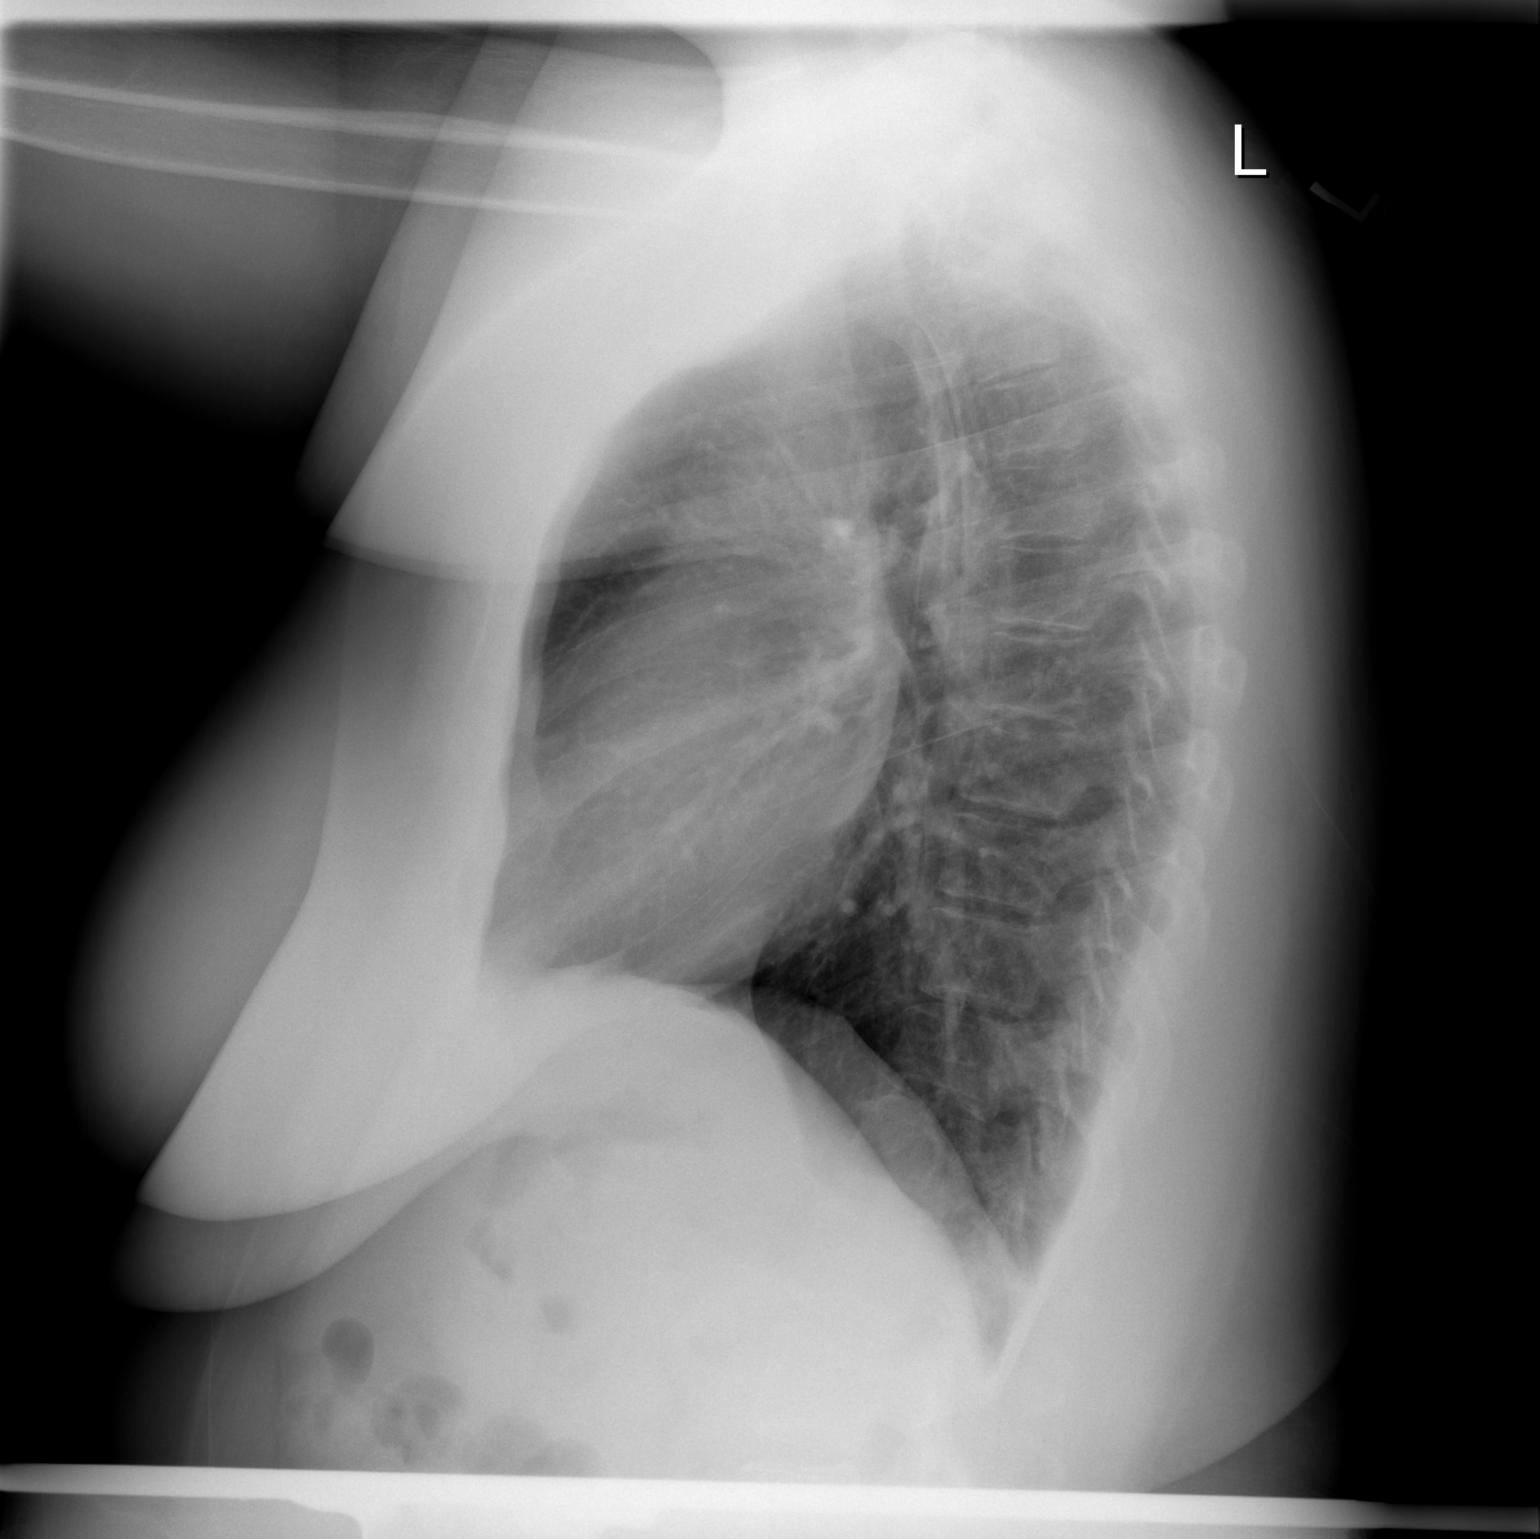

[2 of 2 positions shown; findings below may reference images not displayed]

FINDINGS: The lungs are clear.  Mediastinal contours appear normal.
The heart is within normal limits in size.  No bony abnormality is
seen.
IMPRESSION: No active lung disease.

## 2017-01-25 ENCOUNTER — Encounter (HOSPITAL_COMMUNITY): Payer: Self-pay | Admitting: *Deleted

## 2017-01-25 ENCOUNTER — Ambulatory Visit (HOSPITAL_COMMUNITY)
Admission: EM | Admit: 2017-01-25 | Discharge: 2017-01-25 | Disposition: A | Discharge status: Home / Self Care | Payer: 59 | Attending: Family Medicine | Admitting: Family Medicine

## 2017-01-25 DIAGNOSIS — I10 Essential (primary) hypertension: Secondary | ICD-10-CM

## 2017-01-25 DIAGNOSIS — H1032 Unspecified acute conjunctivitis, left eye: Secondary | ICD-10-CM

## 2017-01-25 MED ORDER — TOBRAMYCIN 0.3 % OP SOLN: 1.0000 [drp] | OPHTHALMIC | 0 refills | Status: DC

## 2017-01-25 MED ORDER — LABETALOL HCL 200 MG PO TABS: 400.0000 mg | ORAL_TABLET | Freq: Two times a day (BID) | ORAL | 5 refills | Status: DC

## 2017-01-25 NOTE — ED Provider Notes (Signed)
[place]   [id number] 01/25/17 Arrival Time: 2831   SUBJECTIVE:  [name] is a 41 y.o. female who presents to the urgent care with complaint of left eye redness and drainage for three days. Patient states symptoms have worsened. Left eye is remarkably red with noted watering. Patient has been using sterile saline drops otc.No foreign body sensation or pain.  No photophobia. Patient awakens with thick sticky left eye lids.  Patient works for [employer]     Past Medical History:  Diagnosis Date  . Dichorionic diamniotic twin gestation 06/07/2013  . Heart murmur    as infant  . Hypertension    no meds now  . Mild or unspecified pre-eclampsia, with delivery 06/07/2013  . Postpartum care following cesarean delivery - twins (2/20) 06/07/2013   Family History  Problem Relation Age of Onset  . Cancer Mother        lymphoma   Social History   Social History  . Marital status: Single    Spouse name: N/A  . Number of children: N/A  . Years of education: N/A   Occupational History  . Not on file.   Social History Main Topics  . Smoking status: Former Smoker    Packs/day: 0.50    Types: Cigarettes    Quit date: 10/03/2012  . Smokeless tobacco: Never Used  . Alcohol use No     Comment: social  . Drug use: No  . Sexual activity: Yes    Birth control/ protection: None   Other Topics Concern  . Not on file   Social History Narrative  . No narrative on file   No outpatient prescriptions have been marked as taking for the 01/25/17 encounter [facility] Encounter).   No Known Allergies    ROS: As per HPI, remainder of ROS negative.   OBJECTIVE:   Vitals:   01/25/17 1056 01/25/17 1101  BP:  (!) 156/103  Pulse: 88   Resp: 17   Temp: 98.1 F (36.7 C)   TempSrc: Oral   SpO2: 100%      General appearance: alert; no distress Eyes: PERRL; EOMI; diffuse injection left eye with watery discharge.  Normal fundus HENT: normocephalic; atraumatic;   external ears normal without trauma; nasal mucosa normal; oral mucosa normal Neck: supple Lungs: clear to auscultation bilaterally Heart: regular rate and rhythm Extremities: no cyanosis or edema; symmetrical with no gross deformities Skin: warm and dry Neurologic: normal gait; grossly normal Psychological: alert and cooperative; normal mood and affect      Labs:  Results for orders placed or performed during the hospital encounter of 10/11/13  Wound culture  Result Value Ref Range   Specimen Description WOUND LEFT ARM    Special Requests NONE    Gram Stain      NO WBC SEEN NO SQUAMOUS EPITHELIAL CELLS SEEN NO ORGANISMS SEEN Performed at [facility]   Culture      RARE STAPHYLOCOCCUS AUREUS Note: RIFAMPIN AND GENTAMICIN SHOULD NOT BE USED AS SINGLE DRUGS FOR TREATMENT OF STAPH INFECTIONS. Performed at [facility]   Report Status 10/14/2013 FINAL    Organism ID, Bacteria STAPHYLOCOCCUS AUREUS       Susceptibility   Staphylococcus aureus - MIC*    CLINDAMYCIN <=0.25 SENSITIVE Sensitive     ERYTHROMYCIN <=0.25 SENSITIVE Sensitive     GENTAMICIN <=0.5 SENSITIVE Sensitive     LEVOFLOXACIN 0.25 SENSITIVE Sensitive     OXACILLIN <=0.25 SENSITIVE Sensitive     PENICILLIN 0.12  SENSITIVE Sensitive     RIFAMPIN <=0.5 SENSITIVE Sensitive     TRIMETH/SULFA <=10 SENSITIVE Sensitive     VANCOMYCIN 1 SENSITIVE Sensitive     TETRACYCLINE <=1 SENSITIVE Sensitive     MOXIFLOXACIN <=0.25 SENSITIVE Sensitive     * RARE STAPHYLOCOCCUS AUREUS    Labs Reviewed - No data to display  No results found.     ASSESSMENT & PLAN:  1. Acute bacterial conjunctivitis of left eye   2. Essential hypertension     Meds ordered this encounter  Medications  . labetalol (NORMODYNE) 200 MG tablet    Sig: Take 2 tablets (400 mg total) by mouth 2 (two) times daily.    Dispense:  60 tablet    Refill:  5  . tobramycin (TOBREX) 0.3 % ophthalmic solution    Sig: Place 1 drop into  the left eye every 4 (four) hours.    Dispense:  5 mL    Refill:  0    Reviewed expectations re: course of current medical issues. Questions answered. Outlined signs and symptoms indicating need for more acute intervention. Patient verbalized understanding. After Visit Summary given.    Procedures:      Robyn Haber, MD 01/25/17 1108

## 2017-01-25 NOTE — Discharge Instructions (Signed)
Please return if your high continues to bother you on Sunday  Also, please make an appointment with your primary care provider in a month to check your blood pressure.

## 2017-01-25 NOTE — ED Triage Notes (Addendum)
Patient reports after mowing her grass on Tuesday she developed left eye redness and drainage. Patient states symptoms have worsened. Left eye is remarkably red with noted watering. Patient has been using sterile saline drops otc.

## 2017-02-01 ENCOUNTER — Ambulatory Visit (INDEPENDENT_AMBULATORY_CARE_PROVIDER_SITE_OTHER): Payer: 59 | Admitting: Family Medicine

## 2017-02-01 ENCOUNTER — Encounter: Payer: Self-pay | Admitting: Family Medicine

## 2017-02-01 VITALS — BP 150/86 | HR 81 | Temp 98.2°F | Resp 18 | Ht 67.32 in | Wt 248.2 lb

## 2017-02-01 DIAGNOSIS — I10 Essential (primary) hypertension: Secondary | ICD-10-CM | POA: Diagnosis not present

## 2017-02-01 DIAGNOSIS — Z8632 Personal history of gestational diabetes: Secondary | ICD-10-CM

## 2017-02-01 DIAGNOSIS — Z7689 Persons encountering health services in other specified circumstances: Secondary | ICD-10-CM

## 2017-02-01 MED ORDER — AMLODIPINE BESYLATE 5 MG PO TABS: 5.0000 mg | ORAL_TABLET | Freq: Every day | ORAL | 1 refills | Status: DC

## 2017-02-01 NOTE — Progress Notes (Signed)
10/19/20185:38 PM  [name] 09/29/1975, 41 y.o. female [id number]  Chief Complaint  Patient presents with  . Establish Care  . Medication Problem    labetlol HCI    HPI:   Patient is a 41 y.o. female with past medical history significant for HTN and gestational DM who presents today to establish care.  She reports she used to be on lisinopril/hctz, was able to get off of meds after losing weight but then developed HTN and DM with her twin pregnancy 3 years ago. Since then her weight has remained higher than desired and her BP has not normalized. She has not been on meds for a while, not sure that she should remain on labetalol. She is not on BC. She has not had her hgba1c checked since delivery. She just had her gyn exam earlier today. She has started to eat healthier, follows low salt diet, plan on joining [employer].    Depression screen PHQ 2/9 02/01/2017  Decreased Interest 0  Down, Depressed, Hopeless 0  PHQ - 2 Score 0    No Known Allergies  Patient reports seeing in her chart that she was allergic to tetracycline, but she does not remember if she has had any issues  Prior to Admission medications   Medication Sig Start Date End Date Taking? Authorizing Provider  tobramycin (TOBREX) 0.3 % ophthalmic solution Place 1 drop into the left eye every 4 (four) hours. Patient not taking: Reported on 02/01/2017 01/25/17   Robyn Haber, MD    Past Medical History:  Diagnosis Date  . Allergy   . Dichorionic diamniotic twin gestation 06/07/2013  . Gestational diabetes   . Heart murmur    as infant  . Hypertension    no meds now  . Mild or unspecified pre-eclampsia, with delivery 06/07/2013  . Postpartum care following cesarean delivery - twins (2/20) 06/07/2013    Past Surgical History:  Procedure Laterality Date  . CESAREAN SECTION N/A 06/05/2013   Procedure: Primary CESAREAN SECTION; Twins;  Surgeon: Lovenia Kim, MD;  Location: [facility];  Service: Obstetrics;   Laterality: N/A;  EDD: 06/30/13  . PARTIAL MASTECTOMY WITH NEEDLE LOCALIZATION  03/05/2012   Procedure: PARTIAL MASTECTOMY WITH NEEDLE LOCALIZATION;  Surgeon: Marcello Moores A. Cornett, MD;  Location: [facility];  Service: General;  Laterality: Right;  . reconstructive surgery  22-Jan-1977 &1992    Social History  Substance Use Topics  . Smoking status: Current Some Day Smoker    Years: 10.00    Types: Cigarettes    Last attempt to quit: 10/03/2012  . Smokeless tobacco: Never Used     Comment: 1 pack a week  . Alcohol use Yes     Comment: social    Family History  Problem Relation Age of Onset  . Cancer Mother        lymphoma    Review of Systems  Constitutional: Negative for chills and fever.  HENT: Negative for congestion, ear pain and sore throat.   Eyes: Negative for blurred vision.  Respiratory: Negative for cough and shortness of breath.   Cardiovascular: Negative for chest pain, palpitations and leg swelling.  Gastrointestinal: Negative for abdominal pain, nausea and vomiting.  Genitourinary: Negative for dysuria and hematuria.  Neurological: Negative for sensory change, speech change, focal weakness and headaches.  Psychiatric/Behavioral: Negative for depression. The patient is not nervous/anxious.      OBJECTIVE:  Blood pressure (!) 150/86, pulse 81, temperature 98.2 F (36.8 C), temperature source Oral, resp.  rate 18, height 5' 7.32" (1.71 m), weight 248 lb 3.2 oz (112.6 kg), last menstrual period 01/28/2017, SpO2 99 %, not currently breastfeeding.  Physical Exam  Constitutional: She is oriented to person, place, and time and well-developed, well-nourished, and in no distress.  HENT:  Head: Normocephalic and atraumatic.  Right Ear: Hearing, tympanic membrane, external ear and ear canal normal.  Left Ear: Hearing, tympanic membrane, external ear and ear canal normal.  Mouth/Throat: Oropharynx is clear and moist.  Eyes: Pupils are equal, round, and reactive to  light. EOM are normal.  Neck: Neck supple. No thyromegaly present.  Cardiovascular: Normal rate, regular rhythm and normal heart sounds.  Exam reveals no gallop and no friction rub.   No murmur heard. Pulmonary/Chest: Effort normal and breath sounds normal. She has no wheezes. She has no rales.  Abdominal: Soft. Bowel sounds are normal. She exhibits no distension and no mass. There is no tenderness.  Musculoskeletal: She exhibits no edema.  Lymphadenopathy:    She has no cervical adenopathy.  Neurological: She is alert and oriented to person, place, and time. Gait normal.  Skin: Skin is warm and dry.  Psychiatric: Mood and affect normal.    ASSESSMENT and PLAN  1. Essential hypertension, benign Restarting BP meds today, New med r/se/b discussed. Strongly encouraged LFM with diet and exercise, weight loss. Patient not interested in quitting smoking,  - amLODipine (NORVASC) 5 MG tablet; Take 1 tablet (5 mg total) by mouth daily. - CBC with Differential - Comprehensive metabolic panel - Lipid panel - TSH  2. History of gestational diabetes Checking Hgba1c today.  - Hemoglobin A1c  3. Encounter to establish care PMH, PSH, meds, allergies, Fhx, Shx, reviewed with patient today.  Return in about 3 months (around 05/04/2017).    Rutherford Guys, MD [facility] [place],  [address] Ph.  [phone] Fax [phone]

## 2017-02-01 NOTE — Patient Instructions (Addendum)
Hypertension Hypertension, commonly called high blood pressure, is when the force of blood pumping through the arteries is too strong. The arteries are the blood vessels that carry blood from the heart throughout the body. Hypertension forces the heart to work harder to pump blood and may cause arteries to become narrow or stiff. Having untreated or uncontrolled hypertension can cause heart attacks, strokes, kidney disease, and other problems. A blood pressure reading consists of a higher number over a lower number. Ideally, your blood pressure should be below 120/80. The first ("top") number is called the systolic pressure. It is a measure of the pressure in your arteries as your heart beats. The second ("bottom") number is called the diastolic pressure. It is a measure of the pressure in your arteries as the heart relaxes. What are the causes? The cause of this condition is not known. What increases the risk? Some risk factors for high blood pressure are under your control. Others are not. Factors you can change  Smoking.  Having type 2 diabetes mellitus, high cholesterol, or both.  Not getting enough exercise or physical activity.  Being overweight.  Having too much fat, sugar, calories, or salt (sodium) in your diet.  Drinking too much alcohol. Factors that are difficult or impossible to change  Having chronic kidney disease.  Having a family history of high blood pressure.  Age. Risk increases with age.  Race. You may be at higher risk if you are African-American.  Gender. Men are at higher risk than women before age 45. After age 65, women are at higher risk than men.  Having obstructive sleep apnea.  Stress. What are the signs or symptoms? Extremely high blood pressure (hypertensive crisis) may cause:  Headache.  Anxiety.  Shortness of breath.  Nosebleed.  Nausea and vomiting.  Severe chest pain.  Jerky movements you cannot control (seizures).  How is this  diagnosed? This condition is diagnosed by measuring your blood pressure while you are seated, with your arm resting on a surface. The cuff of the blood pressure monitor will be placed directly against the skin of your upper arm at the level of your heart. It should be measured at least twice using the same arm. Certain conditions can cause a difference in blood pressure between your right and left arms. Certain factors can cause blood pressure readings to be lower or higher than normal (elevated) for a short period of time:  When your blood pressure is higher when you are in a health care provider's office than when you are at home, this is called white coat hypertension. Most people with this condition do not need medicines.  When your blood pressure is higher at home than when you are in a health care provider's office, this is called masked hypertension. Most people with this condition may need medicines to control blood pressure.  If you have a high blood pressure reading during one visit or you have normal blood pressure with other risk factors:  You may be asked to return on a different day to have your blood pressure checked again.  You may be asked to monitor your blood pressure at home for 1 week or longer.  If you are diagnosed with hypertension, you may have other blood or imaging tests to help your health care provider understand your overall risk for other conditions. How is this treated? This condition is treated by making healthy lifestyle changes, such as eating healthy foods, exercising more, and reducing your alcohol intake. Your   health care provider may prescribe medicine if lifestyle changes are not enough to get your blood pressure under control, and if:  Your systolic blood pressure is above 130.  Your diastolic blood pressure is above 80.  Your personal target blood pressure may vary depending on your medical conditions, your age, and other factors. Follow these  instructions at home: Eating and drinking  Eat a diet that is high in fiber and potassium, and low in sodium, added sugar, and fat. An example eating plan is called the DASH (Dietary Approaches to Stop Hypertension) diet. To eat this way: ? Eat plenty of fresh fruits and vegetables. Try to fill half of your plate at each meal with fruits and vegetables. ? Eat whole grains, such as whole wheat pasta, brown rice, or whole grain bread. Fill about one quarter of your plate with whole grains. ? Eat or drink low-fat dairy products, such as skim milk or low-fat yogurt. ? Avoid fatty cuts of meat, processed or cured meats, and poultry with skin. Fill about one quarter of your plate with lean proteins, such as fish, chicken without skin, beans, eggs, and tofu. ? Avoid premade and processed foods. These tend to be higher in sodium, added sugar, and fat.  Reduce your daily sodium intake. Most people with hypertension should eat less than 1,500 mg of sodium a day.  Limit alcohol intake to no more than 1 drink a day for nonpregnant women and 2 drinks a day for men. One drink equals 12 oz of beer, 5 oz of wine, or 1 oz of hard liquor. Lifestyle  Work with your health care provider to maintain a healthy body weight or to lose weight. Ask what an ideal weight is for you.  Get at least 30 minutes of exercise that causes your heart to beat faster (aerobic exercise) most days of the week. Activities may include walking, swimming, or biking.  Include exercise to strengthen your muscles (resistance exercise), such as pilates or lifting weights, as part of your weekly exercise routine. Try to do these types of exercises for 30 minutes at least 3 days a week.  Do not use any products that contain nicotine or tobacco, such as cigarettes and e-cigarettes. If you need help quitting, ask your health care provider.  Monitor your blood pressure at home as told by your health care provider.  Keep all follow-up visits as  told by your health care provider. This is important. Medicines  Take over-the-counter and prescription medicines only as told by your health care provider. Follow directions carefully. Blood pressure medicines must be taken as prescribed.  Do not skip doses of blood pressure medicine. Doing this puts you at risk for problems and can make the medicine less effective.  Ask your health care provider about side effects or reactions to medicines that you should watch for. Contact a health care provider if:  You think you are having a reaction to a medicine you are taking.  You have headaches that keep coming back (recurring).  You feel dizzy.  You have swelling in your ankles.  You have trouble with your vision. Get help right away if:  You develop a severe headache or confusion.  You have unusual weakness or numbness.  You feel faint.  You have severe pain in your chest or abdomen.  You vomit repeatedly.  You have trouble breathing. Summary  Hypertension is when the force of blood pumping through your arteries is too strong. If this condition is not   controlled, it may put you at risk for serious complications.  Your personal target blood pressure may vary depending on your medical conditions, your age, and other factors. For most people, a normal blood pressure is less than 120/80.  Hypertension is treated with lifestyle changes, medicines, or a combination of both. Lifestyle changes include weight loss, eating a healthy, low-sodium diet, exercising more, and limiting alcohol. This information is not intended to replace advice given to you by your health care provider. Make sure you discuss any questions you have with your health care provider. Document Released: 04/02/2005 Document Revised: 02/29/2016 Document Reviewed: 02/29/2016 Elsevier Interactive Patient Education  2018 Elsevier Inc.     IF you received an x-ray today, you will receive an invoice from [facility]. Please contact [facility] at [phone] with questions or concerns regarding your invoice.   IF you received labwork today, you will receive an invoice from [facility]. Please contact [facility] at [phone] with questions or concerns regarding your invoice.   Our billing staff will not be able to assist you with questions regarding bills from these companies.  You will be contacted with the lab results as soon as they are available. The fastest way to get your results is to activate your My Chart account. Instructions are located on the last page of this paperwork. If you have not heard from us regarding the results in 2 weeks, please contact this office.     

## 2017-02-02 LAB — CBC WITH DIFFERENTIAL/PLATELET
Basophils Absolute: 0 10*3/uL (ref 0.0–0.2)
Basos: 0 %
EOS (ABSOLUTE): 0 10*3/uL (ref 0.0–0.4)
Eos: 1 %
Hematocrit: 37.8 % (ref 34.0–46.6)
Hemoglobin: 12.3 g/dL (ref 11.1–15.9)
Immature Grans (Abs): 0 10*3/uL (ref 0.0–0.1)
Immature Granulocytes: 0 %
Lymphocytes Absolute: 2.7 10*3/uL (ref 0.7–3.1)
Lymphs: 45 %
MCH: 29.1 pg (ref 26.6–33.0)
MCHC: 32.5 g/dL (ref 31.5–35.7)
MCV: 89 fL (ref 79–97)
Monocytes Absolute: 0.5 10*3/uL (ref 0.1–0.9)
Monocytes: 9 %
Neutrophils Absolute: 2.6 10*3/uL (ref 1.4–7.0)
Neutrophils: 45 %
Platelets: 255 10*3/uL (ref 150–379)
RBC: 4.23 x10E6/uL (ref 3.77–5.28)
RDW: 13.2 % (ref 12.3–15.4)
WBC: 5.9 10*3/uL (ref 3.4–10.8)

## 2017-02-02 LAB — HEMOGLOBIN A1C
Est. average glucose Bld gHb Est-mCnc: 111 mg/dL
Hgb A1c MFr Bld: 5.5 % (ref 4.8–5.6)

## 2017-02-02 LAB — COMPREHENSIVE METABOLIC PANEL
ALT: 11 IU/L (ref 0–32)
AST: 16 IU/L (ref 0–40)
Albumin/Globulin Ratio: 1.5 (ref 1.2–2.2)
Albumin: 4.4 g/dL (ref 3.5–5.5)
Alkaline Phosphatase: 51 IU/L (ref 39–117)
BUN/Creatinine Ratio: 9 (ref 9–23)
BUN: 8 mg/dL (ref 6–24)
Bilirubin Total: <0.2 mg/dL (ref 0.0–1.2)
CO2: 24 mmol/L (ref 20–29)
Calcium: 9.5 mg/dL (ref 8.7–10.2)
Chloride: 100 mmol/L (ref 96–106)
Creatinine, Ser: 0.86 mg/dL (ref 0.57–1.00)
GFR calc Af Amer: 98 mL/min/{1.73_m2} (ref >59)
GFR calc non Af Amer: 85 mL/min/{1.73_m2} (ref >59)
Globulin, Total: 3 g/dL (ref 1.5–4.5)
Glucose: 90 mg/dL (ref 65–99)
Potassium: 4 mmol/L (ref 3.5–5.2)
Sodium: 139 mmol/L (ref 134–144)
Total Protein: 7.4 g/dL (ref 6.0–8.5)

## 2017-02-02 LAB — LIPID PANEL
Chol/HDL Ratio: 3.8 ratio (ref 0.0–4.4)
Cholesterol, Total: 188 mg/dL (ref 100–199)
HDL: 49 mg/dL (ref >39)
LDL Calculated: 108 mg/dL — ABNORMAL HIGH (ref 0–99)
Triglycerides: 157 mg/dL — ABNORMAL HIGH (ref 0–149)
VLDL Cholesterol Cal: 31 mg/dL (ref 5–40)

## 2017-02-02 LAB — TSH: TSH: 1.23 u[IU]/mL (ref 0.450–4.500)

## 2017-02-05 ENCOUNTER — Encounter: Payer: Self-pay | Admitting: Family Medicine

## 2017-05-10 ENCOUNTER — Ambulatory Visit: Payer: 59 | Admitting: Family Medicine

## 2017-06-06 ENCOUNTER — Ambulatory Visit: Payer: 59 | Admitting: Family Medicine

## 2019-07-30 ENCOUNTER — Other Ambulatory Visit: Payer: 59

## 2019-07-31 ENCOUNTER — Ambulatory Visit: Payer: 59 | Attending: Internal Medicine

## 2019-07-31 ENCOUNTER — Other Ambulatory Visit: Payer: Self-pay

## 2019-07-31 DIAGNOSIS — Z20822 Contact with and (suspected) exposure to covid-19: Secondary | ICD-10-CM

## 2019-08-01 LAB — SARS-COV-2, NAA 2 DAY TAT

## 2019-08-01 LAB — NOVEL CORONAVIRUS, NAA: SARS-CoV-2, NAA: DETECTED — AB

## 2019-08-02 ENCOUNTER — Other Ambulatory Visit: Payer: Self-pay | Admitting: Unknown Physician Specialty

## 2019-08-02 ENCOUNTER — Telehealth: Payer: Self-pay | Admitting: Unknown Physician Specialty

## 2019-08-02 ENCOUNTER — Encounter: Payer: Self-pay | Admitting: Physician Assistant

## 2019-08-02 ENCOUNTER — Telehealth: Payer: Self-pay | Admitting: Physician Assistant

## 2019-08-02 DIAGNOSIS — U071 COVID-19: Secondary | ICD-10-CM

## 2019-08-02 MED ORDER — SODIUM CHLORIDE 0.9 % IV SOLN
Freq: Once | INTRAVENOUS | Status: AC
  Filled 2019-08-02: qty 700

## 2019-08-02 NOTE — Telephone Encounter (Signed)
  I connected by phone with [name] on 08/02/2019 at 10:23 AM to discuss the potential use of an new treatment for mild to moderate COVID-19 viral infection in non-hospitalized patients.  This patient is a 44 y.o. female that meets the FDA criteria for Emergency Use Authorization of bamlanivimab/etesevimab or casirivimab/imdevimab.  Has a (+) direct SARS-CoV-2 viral test result  Has mild or moderate COVID-19   Is ? 44 years of age and weighs ? 40 kg  Is NOT hospitalized due to COVID-19  Is NOT requiring oxygen therapy or requiring an increase in baseline oxygen flow rate due to COVID-19  Is within 10 days of symptom onset  Has at least one of the high risk factor(s) for progression to severe COVID-19 and/or hospitalization as defined in EUA.  Specific high risk criteria : BMI >/= 35   I have spoken and communicated the following to the patient or parent/caregiver:  1. FDA has authorized the emergency use of bamlanivimab/etesevimab and casirivimab\imdevimab for the treatment of mild to moderate COVID-19 in adults and pediatric patients with positive results of direct SARS-CoV-2 viral testing who are 52 years of age and older weighing at least 40 kg, and who are at high risk for progressing to severe COVID-19 and/or hospitalization.  2. The significant known and potential risks and benefits of bamlanivimab/etesevimab and casirivimab\imdevimab, and the extent to which such potential risks and benefits are unknown.  3. Information on available alternative treatments and the risks and benefits of those alternatives, including clinical trials.  4. Patients treated with bamlanivimab/etesevimab and casirivimab\imdevimab should continue to self-isolate and use infection control measures (e.g., wear mask, isolate, social distance, avoid sharing personal items, clean and disinfect "high touch" surfaces, and frequent handwashing) according to CDC guidelines.   5. The patient or  parent/caregiver has the option to accept or refuse bamlanivimab/etesevimab or casirivimab\imdevimab .  After reviewing this information with the patient, The patient agreed to proceed with receiving the bamlanimivab infusion and will be provided a copy of the Fact sheet prior to receiving the infusion.   Kathrine Haddock 08/02/2019 10:23 AM

## 2019-08-02 NOTE — Telephone Encounter (Addendum)
Called to discuss with patient about Covid symptoms and the use of bamlanivimab/etesevimab or casirivimab/imdevimab, a monoclonal antibody infusion for those with mild to moderate Covid symptoms and at a high risk of hospitalization.  Pt is qualified for this infusion at the [facility] infusion center due to BMI>35. Last day she would qualify for infusion would be 4/25  Spoke to pt about infusion and she is interested. She has two twin daughters at home and no one to watch them.   Angelena Form PA-C  [facility]

## 2019-08-03 ENCOUNTER — Ambulatory Visit (HOSPITAL_COMMUNITY)
Admission: RE | Admit: 2019-08-03 | Discharge: 2019-08-03 | Disposition: A | Discharge status: Home / Self Care | Payer: No Typology Code available for payment source | Source: Ambulatory Visit | Attending: Pulmonary Disease | Admitting: Pulmonary Disease

## 2019-08-03 DIAGNOSIS — U071 COVID-19: Secondary | ICD-10-CM | POA: Diagnosis present

## 2019-08-03 MED ORDER — ALBUTEROL SULFATE HFA 108 (90 BASE) MCG/ACT IN AERS: 2.0000 | INHALATION_SPRAY | Freq: Once | RESPIRATORY_TRACT | Status: DC | PRN

## 2019-08-03 MED ORDER — SODIUM CHLORIDE 0.9 % IV SOLN: INTRAVENOUS | Status: DC | PRN

## 2019-08-03 MED ORDER — EPINEPHRINE 0.3 MG/0.3ML IJ SOAJ: 0.3000 mg | Freq: Once | INTRAMUSCULAR | Status: DC | PRN

## 2019-08-03 MED ORDER — DIPHENHYDRAMINE HCL 50 MG/ML IJ SOLN: 50.0000 mg | Freq: Once | INTRAMUSCULAR | Status: DC | PRN

## 2019-08-03 MED ORDER — FAMOTIDINE IN NACL 20-0.9 MG/50ML-% IV SOLN: 20.0000 mg | Freq: Once | INTRAVENOUS | Status: DC | PRN

## 2019-08-03 MED ORDER — METHYLPREDNISOLONE SODIUM SUCC 125 MG IJ SOLR: 125.0000 mg | Freq: Once | INTRAMUSCULAR | Status: DC | PRN

## 2019-08-03 NOTE — Progress Notes (Signed)
  Diagnosis: COVID-19  Physician:Dr.wright  Procedure: Covid Infusion Clinic Med: bamlanivimab\etesevimab infusion - Provided patient with bamlanimivab\etesevimab fact sheet for patients, parents and caregivers prior to infusion.  Complications: No immediate complications noted.  Discharge: Discharged home   Claudia Desanctis 08/03/2019

## 2019-08-03 NOTE — Discharge Instructions (Signed)

## 2019-10-28 ENCOUNTER — Telehealth: Payer: Self-pay

## 2019-10-28 NOTE — Telephone Encounter (Signed)
rec'd call from pt. Inquiring about scheduling a COVID vaccine.  Reported she rec'd Infusion therapy on 4/19/ 21.  She is traveling to [place] on 7/28.  Advised pt. that she can take the vaccine 90 days after the Infusion, which would be after 7/19.  Pt. stated she wants to discuss with other family members which vaccines they had, and what their side effects were following the shot.  The pt. Stated she would call back to sched. The vaccine appt.

## 2020-08-25 ENCOUNTER — Emergency Department (HOSPITAL_COMMUNITY)
Admission: EM | Admit: 2020-08-25 | Discharge: 2020-08-26 | Disposition: A | Discharge status: Home / Self Care | Payer: No Typology Code available for payment source | Attending: Emergency Medicine | Admitting: Emergency Medicine

## 2020-08-25 ENCOUNTER — Encounter (HOSPITAL_COMMUNITY): Payer: Self-pay

## 2020-08-25 ENCOUNTER — Other Ambulatory Visit: Payer: Self-pay

## 2020-08-25 DIAGNOSIS — X58XXXA Exposure to other specified factors, initial encounter: Secondary | ICD-10-CM | POA: Insufficient documentation

## 2020-08-25 DIAGNOSIS — Z79899 Other long term (current) drug therapy: Secondary | ICD-10-CM | POA: Diagnosis not present

## 2020-08-25 DIAGNOSIS — I10 Essential (primary) hypertension: Secondary | ICD-10-CM | POA: Insufficient documentation

## 2020-08-25 DIAGNOSIS — F17210 Nicotine dependence, cigarettes, uncomplicated: Secondary | ICD-10-CM | POA: Insufficient documentation

## 2020-08-25 DIAGNOSIS — R21 Rash and other nonspecific skin eruption: Secondary | ICD-10-CM | POA: Diagnosis not present

## 2020-08-25 DIAGNOSIS — T7840XA Allergy, unspecified, initial encounter: Secondary | ICD-10-CM | POA: Insufficient documentation

## 2020-08-25 DIAGNOSIS — L299 Pruritus, unspecified: Secondary | ICD-10-CM | POA: Insufficient documentation

## 2020-08-25 MED ORDER — LORATADINE 10 MG PO TABS
10.0000 mg | ORAL_TABLET | Freq: Once | ORAL | Status: AC
  Administered 2020-08-26: 10 mg via ORAL
  Filled 2020-08-25: qty 1

## 2020-08-25 MED ORDER — METHYLPREDNISOLONE SODIUM SUCC 125 MG IJ SOLR
125.0000 mg | Freq: Once | INTRAMUSCULAR | Status: AC
  Administered 2020-08-26: 125 mg via INTRAVENOUS
  Filled 2020-08-25: qty 2

## 2020-08-25 MED ORDER — FAMOTIDINE IN NACL 20-0.9 MG/50ML-% IV SOLN
20.0000 mg | Freq: Once | INTRAVENOUS | Status: AC
  Administered 2020-08-26: 20 mg via INTRAVENOUS
  Filled 2020-08-25: qty 50

## 2020-08-25 NOTE — ED Triage Notes (Signed)
Pt states she started breaking out in hives around 1400 today, pt took allergy medication and put on calamine lotion. Pt states she fell asleep and when she woke up hives had gotten worse. Pt states she feels like her throat is tight and difficulty breathing.

## 2020-08-26 ENCOUNTER — Encounter (HOSPITAL_COMMUNITY): Payer: Self-pay | Admitting: Emergency Medicine

## 2020-08-26 MED ORDER — FAMOTIDINE 20 MG PO TABS: 20.0000 mg | ORAL_TABLET | Freq: Two times a day (BID) | ORAL | 0 refills | Status: DC

## 2020-08-26 MED ORDER — PREDNISONE 20 MG PO TABS: ORAL_TABLET | ORAL | 0 refills | Status: DC

## 2020-08-26 NOTE — ED Provider Notes (Signed)
[facility] EMERGENCY DEPARTMENT Provider Note   CSN: [id number] Arrival date & time: 08/25/20  2312     History Chief Complaint  Patient presents with  . Allergic Reaction    [name] is a 45 y.o. female.  The history is provided by the patient.  Allergic Reaction Presenting symptoms: itching and rash   Presenting symptoms: no wheezing   Severity:  Moderate Duration:  10 hours Prior allergic episodes:  No prior episodes Context: new detergents/soaps   Relieved by:  Nothing Worsened by:  Nothing Ineffective treatments:  None tried Patient with HTN presents with allergic reaction having tried a new soap and now has hives on her upper chest and extremities.       Past Medical History:  Diagnosis Date  . Allergy   . Dichorionic diamniotic twin gestation 06/07/2013  . Gestational diabetes   . Hypertension    no meds now  . Mild or unspecified pre-eclampsia, with delivery 06/07/2013  . Morbid obesity ([place])   . Postpartum care following cesarean delivery - twins (2/20) 06/07/2013    Patient Active Problem List   Diagnosis Date Noted  . Morbid obesity ([place])   . Postpartum care following cesarean delivery - twins (2/20) 06/07/2013    Past Surgical History:  Procedure Laterality Date  . CESAREAN SECTION N/A 06/05/2013   Procedure: Primary CESAREAN SECTION; Twins;  Surgeon: Lovenia Kim, MD;  Location: [facility];  Service: Obstetrics;  Laterality: N/A;  EDD: 06/30/13  . PARTIAL MASTECTOMY WITH NEEDLE LOCALIZATION  03/05/2012   Procedure: PARTIAL MASTECTOMY WITH NEEDLE LOCALIZATION;  Surgeon: Marcello Moores A. Cornett, MD;  Location: [facility];  Service: General;  Laterality: Right;  . reconstructive surgery  1976-04-21 &1992     OB History    Gravida  3   Para  1   Term      Preterm  1   AB  2   Living  2     SAB  1   IAB  1   Ectopic      Multiple  1   Live Births  2           Family History  Problem Relation Age of  Onset  . Cancer Mother        lymphoma  . Diabetes Mother   . Hypertension Mother   . High Cholesterol Mother   . Stroke Mother   . Heart disease Mother     Social History   Tobacco Use  . Smoking status: Current Some Day Smoker    Years: 10.00    Types: Cigarettes    Last attempt to quit: 10/03/2012    Years since quitting: 7.9  . Smokeless tobacco: Never Used  . Tobacco comment: 1 pack a week  Vaping Use  . Vaping Use: Never used  Substance Use Topics  . Alcohol use: Yes    Comment: social  . Drug use: No    Home Medications Prior to Admission medications   Medication Sig Start Date End Date Taking? Authorizing Provider  amLODipine (NORVASC) 5 MG tablet Take 1 tablet (5 mg total) by mouth daily. 02/01/17   Jacelyn Pi, Lilia Argue, MD  tobramycin (TOBREX) 0.3 % ophthalmic solution Place 1 drop into the left eye every 4 (four) hours. Patient not taking: Reported on 02/01/2017 01/25/17   Robyn Haber, MD    Allergies    Patient has no known allergies.  Review of Systems   Review of  Systems  Constitutional: Negative for fever.  HENT: Negative for congestion, drooling and facial swelling.   Eyes: Negative for visual disturbance.  Respiratory: Negative for shortness of breath, wheezing and stridor.   Cardiovascular: Negative for chest pain.  Gastrointestinal: Negative for abdominal pain.  Genitourinary: Negative for difficulty urinating.  Musculoskeletal: Negative for arthralgias.  Skin: Positive for itching and rash.  Neurological: Negative for dizziness.  Psychiatric/Behavioral: Negative for agitation.  All other systems reviewed and are negative.   Physical Exam Updated Vital Signs BP (!) 168/104 (BP Location: Left Arm)   Pulse 100   Temp 97.6 F (36.4 C) (Oral)   Resp 16   SpO2 100%   Physical Exam Vitals and nursing note reviewed.  Constitutional:      General: She is not in acute distress.    Appearance: Normal appearance.  HENT:     Head:  Normocephalic and atraumatic.     Nose: Nose normal.     Mouth/Throat:     Mouth: Mucous membranes are moist.     Pharynx: Oropharynx is clear.  Eyes:     Conjunctiva/sclera: Conjunctivae normal.     Pupils: Pupils are equal, round, and reactive to light.  Cardiovascular:     Rate and Rhythm: Normal rate and regular rhythm.     Pulses: Normal pulses.     Heart sounds: Normal heart sounds.  Pulmonary:     Effort: Pulmonary effort is normal.     Breath sounds: Normal breath sounds.  Abdominal:     General: Abdomen is flat. Bowel sounds are normal.     Palpations: Abdomen is soft.     Tenderness: There is no abdominal tenderness. There is no guarding.  Musculoskeletal:        General: Normal range of motion.     Cervical back: Normal range of motion and neck supple.  Skin:    General: Skin is warm and dry.     Capillary Refill: Capillary refill takes less than 2 seconds.     Comments: Hives of BUE  Neurological:     General: No focal deficit present.     Mental Status: She is alert and oriented to person, place, and time.     Deep Tendon Reflexes: Reflexes normal.  Psychiatric:        Mood and Affect: Mood normal.        Behavior: Behavior normal.     ED Results / Procedures / Treatments   Labs (all labs ordered are listed, but only abnormal results are displayed) Labs Reviewed - No data to display  EKG None  Radiology No results found.  Procedures Procedures   Medications Ordered in ED Medications  methylPREDNISolone sodium succinate (SOLU-MEDROL) 125 mg/2 mL injection 125 mg (has no administration in time range)  famotidine (PEPCID) IVPB 20 mg premix (has no administration in time range)  loratadine (CLARITIN) tablet 10 mg (has no administration in time range)    ED Course  I have reviewed the triage vital signs and the nursing notes.  Pertinent labs & imaging results that were available during my care of the patient were reviewed by me and considered in my  medical decision making (see chart for details).    discard new soap.  Observed in the ED post medication.  No lip or tongue swelling.    [name] was evaluated in Emergency Department on 08/26/2020 for the symptoms described in the history of present illness. She was evaluated in the context of the global  COVID-19 pandemic, which necessitated consideration that the patient might be at risk for infection with the SARS-CoV-2 virus that causes COVID-19. Institutional protocols and algorithms that pertain to the evaluation of patients at risk for COVID-19 are in a state of rapid change based on information released by regulatory bodies including the CDC and federal and state organizations. These policies and algorithms were followed during the patient's care in the ED.  Final Clinical Impression(s) / ED Diagnoses  Return for intractable cough, coughing up blood, fevers >100.4 unrelieved by medication, shortness of breath, intractable vomiting, chest pain, shortness of breath, weakness, numbness, changes in speech, facial asymmetry, abdominal pain, passing out, Inability to tolerate liquids or food, cough, altered mental status or any concerns. No signs of systemic illness or infection. The patient is nontoxic-appearing on exam and vital signs are within normal limits.  I have reviewed the triage vital signs and the nursing notes. Pertinent labs & imaging results that were available during my care of the patient were reviewed by me and considered in my medical decision making (see chart for details). After history, exam, and medical workup I feel the patient has been appropriately medically screened and is safe for discharge home. Pertinent diagnoses were discussed with the patient. Patient was given return precautions.   [place], Yuliana Vandrunen, MD 08/26/20 [phone]

## 2020-08-26 NOTE — ED Notes (Signed)
Patient verbalizes understanding of discharge instructions. Opportunity for questioning and answers were provided. Armband removed by staff, pt discharged from ED via ambulatory.

## 2020-08-26 NOTE — ED Notes (Signed)
Pt ambulated to the bathroom.  

## 2021-07-05 ENCOUNTER — Encounter: Payer: Self-pay | Admitting: Internal Medicine

## 2021-07-05 ENCOUNTER — Ambulatory Visit (INDEPENDENT_AMBULATORY_CARE_PROVIDER_SITE_OTHER): Payer: No Typology Code available for payment source | Admitting: Internal Medicine

## 2021-07-05 DIAGNOSIS — R739 Hyperglycemia, unspecified: Secondary | ICD-10-CM

## 2021-07-05 DIAGNOSIS — Z1329 Encounter for screening for other suspected endocrine disorder: Secondary | ICD-10-CM

## 2021-07-05 DIAGNOSIS — I10 Essential (primary) hypertension: Secondary | ICD-10-CM | POA: Insufficient documentation

## 2021-07-05 DIAGNOSIS — E785 Hyperlipidemia, unspecified: Secondary | ICD-10-CM

## 2021-07-05 DIAGNOSIS — Z1389 Encounter for screening for other disorder: Secondary | ICD-10-CM

## 2021-07-05 DIAGNOSIS — E559 Vitamin D deficiency, unspecified: Secondary | ICD-10-CM

## 2021-07-05 NOTE — Progress Notes (Signed)
Precharted for note with labs and OV and pt no showed  ? ? ?Flu shot  ?Tdap ?Hpv vaccine  ?Covid shot ? ?Pap Dr. Ronita Hipps ROI ?Mammogram s/p partial mastectomy with needle localization right breast ?Colonoscopy  ? ? ? ?

## 2021-11-29 ENCOUNTER — Inpatient Hospital Stay (HOSPITAL_COMMUNITY)
Admission: AD | Admit: 2021-11-29 | Discharge: 2021-11-29 | Disposition: A | Discharge status: Home / Self Care | Payer: No Typology Code available for payment source | Attending: Obstetrics and Gynecology | Admitting: Obstetrics and Gynecology

## 2021-11-29 ENCOUNTER — Encounter (HOSPITAL_COMMUNITY): Payer: Self-pay | Admitting: Obstetrics

## 2021-11-29 ENCOUNTER — Other Ambulatory Visit: Payer: Self-pay | Admitting: Obstetrics and Gynecology

## 2021-11-29 DIAGNOSIS — O009 Unspecified ectopic pregnancy without intrauterine pregnancy: Secondary | ICD-10-CM | POA: Diagnosis present

## 2021-11-29 LAB — CBC WITH DIFFERENTIAL/PLATELET
Abs Immature Granulocytes: 0.01 10*3/uL (ref 0.00–0.07)
Basophils Absolute: 0 10*3/uL (ref 0.0–0.1)
Basophils Relative: 0 %
Eosinophils Absolute: 0 10*3/uL (ref 0.0–0.5)
Eosinophils Relative: 0 %
HCT: 37 % (ref 36.0–46.0)
Hemoglobin: 12.1 g/dL (ref 12.0–15.0)
Immature Granulocytes: 0 %
Lymphocytes Relative: 49 %
Lymphs Abs: 2.7 10*3/uL (ref 0.7–4.0)
MCH: 29.6 pg (ref 26.0–34.0)
MCHC: 32.7 g/dL (ref 30.0–36.0)
MCV: 90.5 fL (ref 80.0–100.0)
Monocytes Absolute: 0.5 10*3/uL (ref 0.1–1.0)
Monocytes Relative: 9 %
Neutro Abs: 2.4 10*3/uL (ref 1.7–7.7)
Neutrophils Relative %: 42 %
Platelets: 238 10*3/uL (ref 150–400)
RBC: 4.09 MIL/uL (ref 3.87–5.11)
RDW: 13.2 % (ref 11.5–15.5)
WBC: 5.6 10*3/uL (ref 4.0–10.5)
nRBC: 0 % (ref 0.0–0.2)

## 2021-11-29 LAB — COMPREHENSIVE METABOLIC PANEL
ALT: 14 U/L (ref 0–44)
AST: 17 U/L (ref 15–41)
Albumin: 3.6 g/dL (ref 3.5–5.0)
Alkaline Phosphatase: 44 U/L (ref 38–126)
Anion gap: 11 (ref 5–15)
BUN: <5 mg/dL — ABNORMAL LOW (ref 6–20)
CO2: 19 mmol/L — ABNORMAL LOW (ref 22–32)
Calcium: 9 mg/dL (ref 8.9–10.3)
Chloride: 107 mmol/L (ref 98–111)
Creatinine, Ser: 0.81 mg/dL (ref 0.44–1.00)
GFR, Estimated: >60 mL/min (ref >60)
Glucose, Bld: 99 mg/dL (ref 70–99)
Potassium: 3.6 mmol/L (ref 3.5–5.1)
Sodium: 137 mmol/L (ref 135–145)
Total Bilirubin: 0.4 mg/dL (ref 0.3–1.2)
Total Protein: 6.9 g/dL (ref 6.5–8.1)

## 2021-11-29 LAB — HCG, QUANTITATIVE, PREGNANCY: hCG, Beta Chain, Quant, S: 10874 m[IU]/mL — ABNORMAL HIGH (ref <5)

## 2021-11-29 MED ORDER — METHOTREXATE FOR ECTOPIC PREGNANCY
50.0000 mg/m2 | Freq: Once | INTRAMUSCULAR | Status: AC
  Administered 2021-11-29: 115 mg via INTRAMUSCULAR
  Filled 2021-11-29: qty 10

## 2021-11-29 NOTE — MAU Provider Note (Signed)
Event Date/Time   First Provider Initiated Contact with Patient 11/29/21 1143      S [name] is a 46 y.o. [id number] patient who presents to MAU today for methotrexate. She reports it was identified in the office today and she was sent in for treatment.    O Pulse 65   Temp 98.5 F (36.9 C) (Oral)   Resp 16   Ht 5' 5.5" (1.664 m)   Wt 115.5 kg   LMP 10/08/2021   SpO2 100%   BMI 41.72 kg/m  Physical Exam Vitals and nursing note reviewed.  Constitutional:      General: She is not in acute distress.    Appearance: She is well-developed.  HENT:     Head: Normocephalic.  Eyes:     Pupils: Pupils are equal, round, and reactive to light.  Cardiovascular:     Rate and Rhythm: Normal rate and regular rhythm.     Heart sounds: Normal heart sounds.  Pulmonary:     Effort: Pulmonary effort is normal. No respiratory distress.     Breath sounds: Normal breath sounds.  Abdominal:     General: Bowel sounds are normal. There is no distension.     Palpations: Abdomen is soft.     Tenderness: There is no abdominal tenderness.  Skin:    General: Skin is warm and dry.  Neurological:     Mental Status: She is alert and oriented to person, place, and time.  Psychiatric:        Mood and Affect: Mood normal.        Behavior: Behavior normal.        Thought Content: Thought content normal.        Judgment: Judgment normal.     A Medical screening exam complete 1. Ectopic pregnancy, unspecified location, unspecified whether intrauterine pregnancy present      P MD to put in orders for MTX Medical screening complete  Erick Colace 11/29/2021 11:43 AM

## 2021-11-29 NOTE — MAU Note (Signed)
[name] is a 46 y.o. at 7:30 here in MAU reporting: sent from office for MTX.  Dx with rt ectopic on [place] today.   Has been bleeding since July 25th, started as spotting. Not soaking pad, only sees when she wipes, brown now.  Mon was first appt, had blood work done, repeated and [place] today. Denies pain, has occ cramps- but not painful LMP: 6/25 Onset of complaint: end of July Pain score: none Vitals:   11/29/21 1138  BP: (!) 179/101  Pulse: 65  Resp: 16  Temp: 98.5 F (36.9 C)  SpO2: 100%      Lab orders placed from triage:  waiting on orders from office

## 2021-11-29 NOTE — MAU Note (Signed)
Olivia Mackie from [facility] called to see if patient's lab orders were visible on MAU's end. This RN confirmed orders were signed and held.

## 2021-12-02 ENCOUNTER — Encounter (HOSPITAL_COMMUNITY): Payer: Self-pay | Admitting: *Deleted

## 2021-12-02 ENCOUNTER — Inpatient Hospital Stay (HOSPITAL_COMMUNITY)
Admission: AD | Admit: 2021-12-02 | Discharge: 2021-12-02 | Disposition: A | Discharge status: Home / Self Care | Payer: No Typology Code available for payment source | Attending: Obstetrics and Gynecology | Admitting: Obstetrics and Gynecology

## 2021-12-02 DIAGNOSIS — Z3A01 Less than 8 weeks gestation of pregnancy: Secondary | ICD-10-CM | POA: Diagnosis not present

## 2021-12-02 DIAGNOSIS — O09521 Supervision of elderly multigravida, first trimester: Secondary | ICD-10-CM | POA: Insufficient documentation

## 2021-12-02 DIAGNOSIS — O00101 Right tubal pregnancy without intrauterine pregnancy: Secondary | ICD-10-CM

## 2021-12-02 DIAGNOSIS — O3680X0 Pregnancy with inconclusive fetal viability, not applicable or unspecified: Secondary | ICD-10-CM | POA: Insufficient documentation

## 2021-12-02 LAB — COMPREHENSIVE METABOLIC PANEL
ALT: 29 U/L (ref 0–44)
AST: 27 U/L (ref 15–41)
Albumin: 3.6 g/dL (ref 3.5–5.0)
Alkaline Phosphatase: 46 U/L (ref 38–126)
Anion gap: 6 (ref 5–15)
BUN: 5 mg/dL — ABNORMAL LOW (ref 6–20)
CO2: 24 mmol/L (ref 22–32)
Calcium: 9 mg/dL (ref 8.9–10.3)
Chloride: 106 mmol/L (ref 98–111)
Creatinine, Ser: 0.73 mg/dL (ref 0.44–1.00)
GFR, Estimated: >60 mL/min (ref >60)
Glucose, Bld: 121 mg/dL — ABNORMAL HIGH (ref 70–99)
Potassium: 3.8 mmol/L (ref 3.5–5.1)
Sodium: 136 mmol/L (ref 135–145)
Total Bilirubin: 0.4 mg/dL (ref 0.3–1.2)
Total Protein: 6.9 g/dL (ref 6.5–8.1)

## 2021-12-02 LAB — HCG, QUANTITATIVE, PREGNANCY: hCG, Beta Chain, Quant, S: 9029 m[IU]/mL — ABNORMAL HIGH (ref <5)

## 2021-12-02 MED ORDER — METHOTREXATE FOR ECTOPIC PREGNANCY
50.0000 mg/m2 | Freq: Once | INTRAMUSCULAR | Status: AC
  Administered 2021-12-02: 115 mg via INTRAMUSCULAR
  Filled 2021-12-02: qty 10

## 2021-12-02 NOTE — MAU Note (Signed)
[name] is a 46 y.o. at 63w6dhere in MAU reporting: hasn't really felt anything.  Only had one or two pains.  No bleeding.  Onset of complaint: day 4 post MTX Pain score: 0 Vitals:   12/02/21 0753  BP: (!) 144/88  Pulse: 73  Resp: 16  Temp: 98.3 F (36.8 C)  SpO2: 99%      Lab orders placed from triage:  HCG ordered  Was started on BP medication yesterday, did not take it this morning before coming, was in a hurry- trying to get here, will take it when she gets home

## 2021-12-02 NOTE — Progress Notes (Signed)
Call by nurse: Pt in MAU, known right ectopic pregnancy.  Presents for repeat bhcg and repeat MTX treatment per Dr Ronita Hipps. Patient with 2.3x2.1x2.4 right adnexal mass and BHCG 10,874 on 8/16.  She is s/p MTX 8/16.  Pt is asymptomatic per nurse.  Dr Ronita Hipps had informed this nurse and myself of plan for repeat MTX.  Will repeat labs today and plan to repeat MTX pending nml labs. Orders placed and nurse to call prior to administration of MTX.

## 2021-12-02 NOTE — MAU Provider Note (Signed)
None     S Ms. Iretha Kirley is a 46 y.o. [id number] patient who presents to MAU today for Day 4 HCG following MTX. She denies abdominal pain, vaginal bleeding or other concerning symptoms.  O BP (!) 144/88 (BP Location: Right Arm)   Pulse 73   Temp 98.3 F (36.8 C) (Oral)   Resp 16   LMP 10/08/2021   SpO2 99%  Physical Exam Vitals and nursing note reviewed.  Constitutional:      General: She is not in acute distress. Eyes:     Extraocular Movements: Extraocular movements intact.     Pupils: Pupils are equal, round, and reactive to light.  Cardiovascular:     Rate and Rhythm: Normal rate.  Pulmonary:     Effort: Pulmonary effort is normal.  Abdominal:     Palpations: Abdomen is soft.     Tenderness: There is no abdominal tenderness.  Skin:    General: Skin is warm and dry.  Neurological:     General: No focal deficit present.     Mental Status: She is alert and oriented to person, place, and time.  Psychiatric:        Mood and Affect: Mood normal.        Behavior: Behavior normal.        Judgment: Judgment normal.    A Medical screening exam complete Ectopic pregnancy, unspecified location, unspecified whether intrauterine pregnancy present   P MD on call to put in orders Medical screening complete   Renee Harder, CNM 12/02/2021 8:18 AM

## 2021-12-02 NOTE — Discharge Instructions (Signed)
Keep follow up appt for Tues, 8/22 as scheduled at [facility]. If you have severe pain, bleeding, light headed or fever, You need to return to MAU

## 2021-12-22 ENCOUNTER — Ambulatory Visit: Payer: No Typology Code available for payment source | Admitting: Cardiology

## 2022-01-01 ENCOUNTER — Ambulatory Visit: Payer: No Typology Code available for payment source | Attending: Cardiology | Admitting: Cardiology

## 2022-01-01 ENCOUNTER — Encounter: Payer: Self-pay | Admitting: Cardiology

## 2022-01-01 ENCOUNTER — Ambulatory Visit: Payer: No Typology Code available for payment source | Attending: Cardiology

## 2022-01-01 VITALS — BP 180/100 | HR 68 | Ht 65.0 in | Wt 250.0 lb

## 2022-01-01 DIAGNOSIS — I10 Essential (primary) hypertension: Secondary | ICD-10-CM | POA: Diagnosis not present

## 2022-01-01 DIAGNOSIS — R002 Palpitations: Secondary | ICD-10-CM

## 2022-01-01 MED ORDER — HYDROCHLOROTHIAZIDE 25 MG PO TABS: 25.0000 mg | ORAL_TABLET | Freq: Every day | ORAL | 3 refills | Status: DC

## 2022-01-01 NOTE — Patient Instructions (Addendum)
Medication Instructions:  Your physician has recommended you make the following change in your medication:  START: Hydrochlorothiazide 25 mg once daily  Please take your blood pressure daily for 2 weeks and send in a MyChart message. Please include heart rates. If your blood pressure is greater then 130/90 for two days in a row please contact my office before the 2 week mark.   HOW TO TAKE YOUR BLOOD PRESSURE: Rest 5 minutes before taking your blood pressure. Don't smoke or drink caffeinated beverages for at least 30 minutes before. Take your blood pressure before (not after) you eat. Sit comfortably with your back supported and both feet on the floor (don't cross your legs). Elevate your arm to heart level on a table or a desk. Use the proper sized cuff. It should fit smoothly and snugly around your bare upper arm. There should be enough room to slip a fingertip under the cuff. The bottom edge of the cuff should be 1 inch above the crease of the elbow. Ideally, take 3 measurements at one sitting and record the average.   *If you need a refill on your cardiac medications before your next appointment, please call your pharmacy*   Lab Work: None If you have labs (blood work) drawn today and your tests are completely normal, you will receive your results only by: [place] (if you have MyChart) OR A paper copy in the mail If you have any lab test that is abnormal or we need to change your treatment, we will call you to review the results.   Testing/Procedures: Your physician has requested that you have an echocardiogram. Echocardiography is a painless test that uses sound waves to create images of your heart. It provides your doctor with information about the size and shape of your heart and how well your heart's chambers and valves are working. This procedure takes approximately one hour. There are no restrictions for this procedure.  ZIO XT- Long Term Monitor Instructions  Your  physician has requested you wear a ZIO patch monitor for 14 days.  This is a single patch monitor. [employer] supplies one patch monitor per enrollment. Additional stickers are not available. Please do not apply patch if you will be having a Nuclear Stress Test,  Echocardiogram, Cardiac CT, MRI, or Chest Xray during the period you would be wearing the  monitor. The patch cannot be worn during these tests. You cannot remove and re-apply the  ZIO XT patch monitor.  Your ZIO patch monitor will be mailed 3 day USPS to your address on file. It may take 3-5 days  to receive your monitor after you have been enrolled.  Once you have received your monitor, please review the enclosed instructions. Your monitor  has already been registered assigning a specific monitor serial # to you.  Billing and Patient Assistance Program Information  We have supplied [employer] with any of your insurance information on file for billing purposes. [employer] offers a sliding scale Patient Assistance Program for patients that do not have  insurance, or whose insurance does not completely cover the cost of the ZIO monitor.  You must apply for the Patient Assistance Program to qualify for this discounted rate.  To apply, please call [employer] at [phone], select option 4, select option 2, ask to apply for  Patient Assistance Program. Theodore Demark will ask your household income, and how many people  are in your household. They will quote your out-of-pocket cost based on that information.  [employer] will also be  able to set up a 66-month interest-free payment plan if needed.  Applying the monitor   Shave hair from upper left chest.  Hold abrader disc by orange tab. Rub abrader in 40 strokes over the upper left chest as  indicated in your monitor instructions.  Clean area with 4 enclosed alcohol pads. Let dry.  Apply patch as indicated in monitor instructions. Patch will be placed under collarbone on left  side of chest with arrow  pointing upward.  Rub patch adhesive wings for 2 minutes. Remove white label marked "1". Remove the white  label marked "2". Rub patch adhesive wings for 2 additional minutes.  While looking in a mirror, press and release button in center of patch. A small green light will  flash 3-4 times. This will be your only indicator that the monitor has been turned on.  Do not shower for the first 24 hours. You may shower after the first 24 hours.  Press the button if you feel a symptom. You will hear a small click. Record Date, Time and  Symptom in the Patient Logbook.  When you are ready to remove the patch, follow instructions on the last 2 pages of Patient  Logbook. Stick patch monitor onto the last page of Patient Logbook.  Place Patient Logbook in the blue and white box. Use locking tab on box and tape box closed  securely. The blue and white box has prepaid postage on it. Please place it in the mailbox as  soon as possible. Your physician should have your test results approximately 7 days after the  [facility]  Call [facility] [facility]at [phone] you have questions regarding  your ZIO XT patch monitor. Call them immediately if you see an orange light blinking on your  monitor.  If your monitor falls off in less than 4 days, contact our Monitor department at [phone]  If your monitor becomes loose or falls off after 4 days call [employer] at [phone]  suggestions on securing your monitor    Follow-Up: [facility] and your health needs are our priority.  As part of our continuing mission to provide you with exceptional heart care, we have created designated Provider Care Teams.  These Care Teams include your primary Cardiologist (physician) and Advanced Practice Providers (APPs -  Physician Assistants and Nurse Practitioners) who all work together to provide you with the care you need, when you need it.  We  recommend signing up for the patient portal called "MyChart".  Sign up information is provided on this After Visit Summary.  MyChart is used to connect with patients for Virtual Visits (Telemedicine).  Patients are able to view lab/test results, encounter notes, upcoming appointments, etc.  Non-urgent messages can be sent to your provider as well.   To learn more about what you can do with MyChart, go to [email or web address]    Your next appointment:   12 week(s)  The format for your next appointment:   In Person  Provider:   KBerniece Salines DO     Other Instructions   Important Information About Sugar

## 2022-01-01 NOTE — Progress Notes (Unsigned)
Enrolled for [employer] to mail a ZIO XT long term holter monitor to the patients address on file.  

## 2022-01-01 NOTE — Progress Notes (Signed)
Cardiology Office Note:    Date:  01/01/2022   ID:  [name], DOB Nov 19, 1975, MRN [id number]  PCP:  Patient, No Pcp Per  Cardiologist:  Berniece Salines, DO  Electrophysiologist:  None   Referring MD: Langley Gauss A, DO   " I am doing well"  History of Present Illness:    [name] is a 46 y.o. female with a hx of gestational diabetes, hypertension, came to be evaluated for palpitations.  Also presents today to be evaluated for elevated blood pressure.  She tells me that she was started on amlodipine recently but then had significant palpitations.  She notes that she was diagnosed with hypertension from high school.  At that time she tells me that she was also told that she had heart murmur and had wondered monitor.   She had taking hydrochlorothiazide for a little while which really seem to have improved.  She was pregnant with twins at 1 point and she took labetalol.  No chest pain or shortness of breath.  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   Gestational diabetes    Hypertension    no meds now   Mild or unspecified pre-eclampsia, with delivery 06/07/2013   Morbid obesity [facility])    Postpartum care following cesarean delivery - twins (2/20) 06/07/2013    Past Surgical History:  Procedure Laterality Date   CESAREAN SECTION N/A 06/05/2013   Procedure: Primary CESAREAN SECTION; Twins;  Surgeon: Lovenia Kim, MD;  Location: [facility];  Service: Obstetrics;  Laterality: N/A;  EDD: 06/30/13   PARTIAL MASTECTOMY WITH NEEDLE LOCALIZATION  03/05/2012   Procedure: PARTIAL MASTECTOMY WITH NEEDLE LOCALIZATION;  Surgeon: Marcello Moores A. Cornett, MD;  Location: [facility];  Service: General;  Laterality: Right;   reconstructive surgery  10-07-1976 &1992    Current Medications: Current Meds  Medication Sig   hydrochlorothiazide (HYDRODIURIL) 25 MG tablet Take 1 tablet (25 mg total) by mouth daily.     Allergies:    Patient has no known allergies.   Social History   Socioeconomic History   Marital status: Single    Spouse name: Not on file   Number of children: Not on file   Years of education: Not on file   Highest education level: Not on file  Occupational History   Not on file  Tobacco Use   Smoking status: Former    Years: 10.00    Types: Cigarettes    Quit date: 10/03/2012    Years since quitting: 9.2   Smokeless tobacco: Never   Tobacco comments:    1 pack a week  Vaping Use   Vaping Use: Never used  Substance and Sexual Activity   Alcohol use: Yes    Comment: social   Drug use: No   Sexual activity: Yes    Birth control/protection: Condom  Other Topics Concern   Not on file  Social History Narrative   Not on file   Social Determinants of Health   Financial Resource Strain: Not on file  Food Insecurity: Not on file  Transportation Needs: Not on file  Physical Activity: Not on file  Stress: Not on file  Social Connections: Not on file     Family History: The patient's family history includes Cancer in her mother; Diabetes in her mother; Healthy in her father; Heart disease in her mother; High Cholesterol in her mother; Hypertension in her mother; Stroke in her mother.  ROS:   Review of Systems  Constitution: Negative for decreased appetite, fever and weight gain.  HENT: Negative for congestion, ear discharge, hoarse voice and sore throat.   Eyes: Negative for discharge, redness, vision loss in right eye and visual halos.  Cardiovascular: Reports palpitations.  Negative for chest pain, dyspnea on exertion, leg swelling, orthopnea.  Respiratory: Negative for cough, hemoptysis, shortness of breath and snoring.   Endocrine: Negative for heat intolerance and polyphagia.  Hematologic/Lymphatic: Negative for bleeding problem. Does not bruise/bleed easily.  Skin: Negative for flushing, nail changes, rash and suspicious lesions.  Musculoskeletal: Negative for arthritis, joint  pain, muscle cramps, myalgias, neck pain and stiffness.  Gastrointestinal: Negative for abdominal pain, bowel incontinence, diarrhea and excessive appetite.  Genitourinary: Negative for decreased libido, genital sores and incomplete emptying.  Neurological: Negative for brief paralysis, focal weakness, headaches and loss of balance.  Psychiatric/Behavioral: Negative for altered mental status, depression and suicidal ideas.  Allergic/Immunologic: Negative for HIV exposure and persistent infections.    EKGs/Labs/Other Studies Reviewed:    The following studies were reviewed today:   EKG:  The ekg ordered today demonstrates sinus rhythm, heart rate 60 bpm.  Recent Labs: 11/29/2021: Hemoglobin 12.1; Platelets 238 12/02/2021: ALT 29; BUN 5; Creatinine, Ser 0.73; Potassium 3.8; Sodium 136  Recent Lipid Panel    Component Value Date/Time   CHOL 188 02/01/2017 1714   TRIG 157 (H) 02/01/2017 1714   HDL 49 02/01/2017 1714   CHOLHDL 3.8 02/01/2017 1714   LDLCALC 108 (H) 02/01/2017 1714    Physical Exam:    VS:  BP (!) 180/100 (BP Location: Left Arm, Patient Position: Sitting)   Pulse 68   Ht '5\' 5"'$  (1.651 m)   Wt 250 lb (113.4 kg)   LMP 10/08/2021   SpO2 99%   Breastfeeding No   BMI 41.60 kg/m     Wt Readings from Last 3 Encounters:  01/01/22 250 lb (113.4 kg)  12/02/21 254 lb 6.6 oz (115.4 kg)  11/29/21 254 lb 9.6 oz (115.5 kg)     GEN: Well nourished, well developed in no acute distress HEENT: Normal NECK: No JVD; No carotid bruits LYMPHATICS: No lymphadenopathy CARDIAC: S1S2 noted,RRR, 2 out of 6 holosystolic murmurs, rubs, gallops RESPIRATORY:  Clear to auscultation without rales, wheezing or rhonchi  ABDOMEN: Soft, non-tender, non-distended, +bowel sounds, no guarding. EXTREMITIES: No edema, No cyanosis, no clubbing MUSCULOSKELETAL:  No deformity  SKIN: Warm and dry NEUROLOGIC:  Alert and oriented x 3, non-focal PSYCHIATRIC:  Normal affect, good insight  ASSESSMENT:     1. Hypertension, unspecified type   2. Palpitations   3. Morbid obesity ([facility])    PLAN:     She is hypertensive which was manually taken by me.  I will start the patient on hydrochlorothiazide 25 mg daily.  Going straight to high dose considering her blood pressure today.  I would like to add an additional medication but will have the patient send me updated blood pressure in the next week and then we can adjust and add likely an ARB to her regimen because she cannot tolerate amlodipine. We will get echocardiogram to assess for any structural abnormality given her longstanding hypertension. In terms of the palpitations for completeness we will place a monitor on the patient to assess for any arrhythmia.  The patient understands the need to lose weight with diet and exercise. We have discussed specific strategies for this.  The patient is in agreement with the above plan. The patient left  the office in stable condition.  The patient will follow up in   Medication Adjustments/Labs and Tests Ordered: Current medicines are reviewed at length with the patient today.  Concerns regarding medicines are outlined above.  Orders Placed This Encounter  Procedures   LONG TERM MONITOR (3-14 DAYS)   EKG 12-Lead   ECHOCARDIOGRAM COMPLETE   Meds ordered this encounter  Medications   hydrochlorothiazide (HYDRODIURIL) 25 MG tablet    Sig: Take 1 tablet (25 mg total) by mouth daily.    Dispense:  90 tablet    Refill:  3    Patient Instructions  Medication Instructions:  Your physician has recommended you make the following change in your medication:  START: Hydrochlorothiazide 25 mg once daily  Please take your blood pressure daily for 2 weeks and send in a MyChart message. Please include heart rates. If your blood pressure is greater then 130/90 for two days in a row please contact my office before the 2 week mark.   HOW TO TAKE YOUR BLOOD PRESSURE: Rest 5 minutes before taking your blood  pressure. Don't smoke or drink caffeinated beverages for at least 30 minutes before. Take your blood pressure before (not after) you eat. Sit comfortably with your back supported and both feet on the floor (don't cross your legs). Elevate your arm to heart level on a table or a desk. Use the proper sized cuff. It should fit smoothly and snugly around your bare upper arm. There should be enough room to slip a fingertip under the cuff. The bottom edge of the cuff should be 1 inch above the crease of the elbow. Ideally, take 3 measurements at one sitting and record the average.   *If you need a refill on your cardiac medications before your next appointment, please call your pharmacy*   Lab Work: None If you have labs (blood work) drawn today and your tests are completely normal, you will receive your results only by: [place] (if you have MyChart) OR A paper copy in the mail If you have any lab test that is abnormal or we need to change your treatment, we will call you to review the results.   Testing/Procedures: Your physician has requested that you have an echocardiogram. Echocardiography is a painless test that uses sound waves to create images of your heart. It provides your doctor with information about the size and shape of your heart and how well your heart's chambers and valves are working. This procedure takes approximately one hour. There are no restrictions for this procedure.  ZIO XT- Long Term Monitor Instructions  Your physician has requested you wear a ZIO patch monitor for 14 days.  This is a single patch monitor. [employer] supplies one patch monitor per enrollment. Additional stickers are not available. Please do not apply patch if you will be having a Nuclear Stress Test,  Echocardiogram, Cardiac CT, MRI, or Chest Xray during the period you would be wearing the  monitor. The patch cannot be worn during these tests. You cannot remove and re-apply the  ZIO XT patch  monitor.  Your ZIO patch monitor will be mailed 3 day USPS to your address on file. It may take 3-5 days  to receive your monitor after you have been enrolled.  Once you have received your monitor, please review the enclosed instructions. Your monitor  has already been registered assigning a specific monitor serial # to you.  Billing and Patient Assistance Program Information  We have supplied [employer] with  any of your insurance information on file for billing purposes. [employer] offers a sliding scale Patient Assistance Program for patients that do not have  insurance, or whose insurance does not completely cover the cost of the ZIO monitor.  You must apply for the Patient Assistance Program to qualify for this discounted rate.  To apply, please call [employer] at [phone], select option 4, select option 2, ask to apply for  Patient Assistance Program. Theodore Demark will ask your household income, and how many people  are in your household. They will quote your out-of-pocket cost based on that information.  [employer] will also be able to set up a 9-month interest-free payment plan if needed.  Applying the monitor   Shave hair from upper left chest.  Hold abrader disc by orange tab. Rub abrader in 40 strokes over the upper left chest as  indicated in your monitor instructions.  Clean area with 4 enclosed alcohol pads. Let dry.  Apply patch as indicated in monitor instructions. Patch will be placed under collarbone on left  side of chest with arrow pointing upward.  Rub patch adhesive wings for 2 minutes. Remove white label marked "1". Remove the white  label marked "2". Rub patch adhesive wings for 2 additional minutes.  While looking in a mirror, press and release button in center of patch. A small green light will  flash 3-4 times. This will be your only indicator that the monitor has been turned on.  Do not shower for the first 24 hours. You may shower after the first 24 hours.  Press the  button if you feel a symptom. You will hear a small click. Record Date, Time and  Symptom in the Patient Logbook.  When you are ready to remove the patch, follow instructions on the last 2 pages of Patient  Logbook. Stick patch monitor onto the last page of Patient Logbook.  Place Patient Logbook in the blue and white box. Use locking tab on box and tape box closed  securely. The blue and white box has prepaid postage on it. Please place it in the mailbox as  soon as possible. Your physician should have your test results approximately 7 days after the  [facility]  Call [employer] [phone] you have questions regarding  your ZIO XT patch monitor. Call them immediately if you see an orange light blinking on your  monitor.  If your monitor falls off in less than 4 days, contact our Monitor department at [phone]  If your monitor becomes loose or falls off after 4 days call [employer] at [phone]  suggestions on securing your monitor    Follow-Up: [facility] and your health needs are our priority.  As part of our continuing mission to provide you with exceptional heart care, we have created designated Provider Care Teams.  These Care Teams include your primary Cardiologist (physician) and Advanced Practice Providers (APPs -  Physician Assistants and Nurse Practitioners) who all work together to provide you with the care you need, when you need it.  We recommend signing up for the patient portal called "MyChart".  Sign up information is provided on this After Visit Summary.  MyChart is used to connect with patients for Virtual Visits (Telemedicine).  Patients are able to view lab/test results, encounter notes, upcoming appointments, etc.  Non-urgent messages can be sent to your provider as well.   To learn more about what you can  do with MyChart, go to [email or web address].    Your next appointment:    12 week(s)  The format for your next appointment:   In Person  Provider:   Berniece Salines, DO     Other Instructions   Important Information About Sugar         Adopting a Healthy Lifestyle.  Know what a healthy weight is for you (roughly BMI <25) and aim to maintain this   Aim for 7+ servings of fruits and vegetables daily   65-80+ fluid ounces of water or unsweet tea for healthy kidneys   Limit to max 1 drink of alcohol per day; avoid smoking/tobacco   Limit animal fats in diet for cholesterol and heart health - choose grass fed whenever available   Avoid highly processed foods, and foods high in saturated/trans fats   Aim for low stress - take time to unwind and care for your mental health   Aim for 150 min of moderate intensity exercise weekly for heart health, and weights twice weekly for bone health   Aim for 7-9 hours of sleep daily   When it comes to diets, agreement about the perfect plan isnt easy to find, even among the experts. Experts at the [employer] developed an idea known as the Healthy Eating Plate. Just imagine a plate divided into logical, healthy portions.   The emphasis is on diet quality:   Load up on vegetables and fruits - one-half of your plate: Aim for color and variety, and remember that potatoes dont count.   Go for whole grains - one-quarter of your plate: Whole wheat, barley, wheat berries, quinoa, oats, brown rice, and foods made with them. If you want pasta, go with whole wheat pasta.   Protein power - one-quarter of your plate: Fish, chicken, beans, and nuts are all healthy, versatile protein sources. Limit red meat.   The diet, however, does go beyond the plate, offering a few other suggestions.   Use healthy plant oils, such as olive, canola, soy, corn, sunflower and peanut. Check the labels, and avoid partially hydrogenated oil, which have unhealthy trans fats.   If youre thirsty, drink water. Coffee and  tea are good in moderation, but skip sugary drinks and limit milk and dairy products to one or two daily servings.   The type of carbohydrate in the diet is more important than the amount. Some sources of carbohydrates, such as vegetables, fruits, whole grains, and beans-are healthier than others.   Finally, stay active  Signed, Berniece Salines, DO  01/01/2022 12:35 PM    [facility]

## 2022-01-15 ENCOUNTER — Ambulatory Visit (HOSPITAL_COMMUNITY): Payer: No Typology Code available for payment source | Attending: Cardiology

## 2022-01-15 DIAGNOSIS — I10 Essential (primary) hypertension: Secondary | ICD-10-CM | POA: Diagnosis present

## 2022-01-15 LAB — ECHOCARDIOGRAM COMPLETE
Area-P 1/2: 3.42 cm2
S' Lateral: 2.8 cm

## 2022-01-18 DIAGNOSIS — R002 Palpitations: Secondary | ICD-10-CM

## 2022-03-19 ENCOUNTER — Encounter: Payer: Self-pay | Admitting: Cardiology

## 2022-03-19 ENCOUNTER — Ambulatory Visit: Payer: No Typology Code available for payment source | Attending: Cardiology | Admitting: Cardiology

## 2022-03-19 VITALS — BP 146/92 | HR 76 | Ht 66.0 in | Wt 258.2 lb

## 2022-03-19 DIAGNOSIS — E781 Pure hyperglyceridemia: Secondary | ICD-10-CM

## 2022-03-19 DIAGNOSIS — E782 Mixed hyperlipidemia: Secondary | ICD-10-CM

## 2022-03-19 DIAGNOSIS — I10 Essential (primary) hypertension: Secondary | ICD-10-CM

## 2022-03-19 MED ORDER — AMLODIPINE BESYLATE 5 MG PO TABS: 5.0000 mg | ORAL_TABLET | Freq: Every day | ORAL | 3 refills | Status: DC

## 2022-03-19 NOTE — Patient Instructions (Addendum)
Medication Instructions:  Your physician has recommended you make the following change in your medication:   START Amlodipine 5 mg tablet by mouth ONCE daily  *If you need a refill on your cardiac medications before your next appointment, please call your pharmacy*   Lab Work: None  If you have labs (blood work) drawn today and your tests are completely normal, you will receive your results only by: [place] (if you have MyChart) OR A paper copy in the mail If you have any lab test that is abnormal or we need to change your treatment, we will call you to review the results.   Testing/Procedures: none   Follow-Up: At [facility], you and your health needs are our priority.  As part of our continuing mission to provide you with exceptional heart care, we have created designated Provider Care Teams.  These Care Teams include your primary Cardiologist (physician) and Advanced Practice Providers (APPs -  Physician Assistants and Nurse Practitioners) who all work together to provide you with the care you need, when you need it.  We recommend signing up for the patient portal called "MyChart".  Sign up information is provided on this After Visit Summary.  MyChart is used to connect with patients for Virtual Visits (Telemedicine).  Patients are able to view lab/test results, encounter notes, upcoming appointments, etc.  Non-urgent messages can be sent to your provider as well.   To learn more about what you can do with MyChart, go to [email or web address].    Your next appointment:   6 month(s)  The format for your next appointment:   In Person  Provider:   Berniece Salines, DO     Other Instructions Dr. Harriet Masson has Referred you to our [facility] PharmD - with Weight Management.  Important Information About Sugar

## 2022-03-19 NOTE — Progress Notes (Unsigned)
Cardiology Office Note:    Date:  03/19/2022   ID:  [name], DOB 06/05/1975, MRN [id number]  PCP:  Patient, No Pcp Per  Cardiologist:  Berniece Salines, DO  Electrophysiologist:  None   Referring MD: No ref. provider found   " I am doing ok"  History of Present Illness:    [name] is a 46 y.o. female with a hx of gestational diabetes, hypertension, morbid obesity here today for follow-up visit.  At her visit on January 01, 2022 she was hypertensive, I started the patient on her hydrochlorothiazide 25 mg daily.  At that time she said she did not tolerate amlodipine in the past.  We also discussed getting echocardiogram given her longstanding hypertension as well as abnormal and for completeness a ZIO monitor for palpitations.  She had her testing done her echocardiogram was normal.  Still waiting for a monitor.  Today she tells me she still dealing with some level of stress given the fact that she still dealing with the ectopic pregnancy  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   Gestational diabetes    Hypertension    no meds now   Mild or unspecified pre-eclampsia, with delivery 06/07/2013   Morbid obesity [facility])    Postpartum care following cesarean delivery - twins (2/20) 06/07/2013    Past Surgical History:  Procedure Laterality Date   CESAREAN SECTION N/A 06/05/2013   Procedure: Primary CESAREAN SECTION; Twins;  Surgeon: Lovenia Kim, MD;  Location: [facility];  Service: Obstetrics;  Laterality: N/A;  EDD: 06/30/13   PARTIAL MASTECTOMY WITH NEEDLE LOCALIZATION  03/05/2012   Procedure: PARTIAL MASTECTOMY WITH NEEDLE LOCALIZATION;  Surgeon: Marcello Moores A. Cornett, MD;  Location: [facility];  Service: General;  Laterality: Right;   reconstructive surgery  1976-05-30 &1992    Current Medications: Current Meds  Medication Sig   amLODipine (NORVASC) 5 MG tablet Take 1 tablet (5 mg total) by mouth daily.    hydrochlorothiazide (HYDRODIURIL) 25 MG tablet Take 1 tablet (25 mg total) by mouth daily.     Allergies:   Patient has no known allergies.   Social History   Socioeconomic History   Marital status: Single    Spouse name: Not on file   Number of children: Not on file   Years of education: Not on file   Highest education level: Not on file  Occupational History   Not on file  Tobacco Use   Smoking status: Former    Years: 10.00    Types: Cigarettes    Quit date: 10/03/2012    Years since quitting: 9.4   Smokeless tobacco: Never   Tobacco comments:    1 pack a week  Vaping Use   Vaping Use: Never used  Substance and Sexual Activity   Alcohol use: Yes    Comment: social   Drug use: No   Sexual activity: Yes    Birth control/protection: Condom  Other Topics Concern   Not on file  Social History Narrative   Not on file   Social Determinants of Health   Financial Resource Strain: Not on file  Food Insecurity: Not on file  Transportation Needs: Not on file  Physical Activity: Not on file  Stress: Not on file  Social Connections: Not on file     Family History: The patient's family history includes Cancer in her mother; Diabetes in her mother; Healthy in her father; Heart disease  in her mother; High Cholesterol in her mother; Hypertension in her mother; Stroke in her mother.  ROS:   Review of Systems  Constitution: Negative for decreased appetite, fever and weight gain.  HENT: Negative for congestion, ear discharge, hoarse voice and sore throat.   Eyes: Negative for discharge, redness, vision loss in right eye and visual halos.  Cardiovascular: Negative for chest pain, dyspnea on exertion, leg swelling, orthopnea and palpitations.  Respiratory: Negative for cough, hemoptysis, shortness of breath and snoring.   Endocrine: Negative for heat intolerance and polyphagia.  Hematologic/Lymphatic: Negative for bleeding problem. Does not bruise/bleed easily.  Skin:  Negative for flushing, nail changes, rash and suspicious lesions.  Musculoskeletal: Negative for arthritis, joint pain, muscle cramps, myalgias, neck pain and stiffness.  Gastrointestinal: Negative for abdominal pain, bowel incontinence, diarrhea and excessive appetite.  Genitourinary: Negative for decreased libido, genital sores and incomplete emptying.  Neurological: Negative for brief paralysis, focal weakness, headaches and loss of balance.  Psychiatric/Behavioral: Negative for altered mental status, depression and suicidal ideas.  Allergic/Immunologic: Negative for HIV exposure and persistent infections.    EKGs/Labs/Other Studies Reviewed:    The following studies were reviewed today:   EKG:  None today   TTE 01/01/2022 IMPRESSIONS     1. Left ventricular ejection fraction, by estimation, is 60 to 65%. The left ventricle has normal function. The left ventricle has no regional wall motion abnormalities. There is mild left ventricular hypertrophy.  Left ventricular diastolic parameters were normal.   2. Right ventricular systolic function is normal. The right ventricular size is normal.   3. Left atrial size was moderately dilated.   4. Cannot r/o PFO.   5. The mitral valve is abnormal. Trivial mitral valve regurgitation. No  evidence of mitral stenosis.   6. The aortic valve is tricuspid. Aortic valve regurgitation is not  visualized. No aortic stenosis is present.   7. The inferior vena cava is normal in size with greater than 50%  respiratory variability, suggesting right atrial pressure of 3 mmHg.   FINDINGS   Left Ventricle: Left ventricular ejection fraction, by estimation, is 60  to 65%. The left ventricle has normal function. The left ventricle has no  regional wall motion abnormalities. The left ventricular internal cavity  size was normal in size. There is   mild left ventricular hypertrophy. Left ventricular diastolic parameters  were normal.   Right Ventricle:  The right ventricular size is normal. No increase in  right ventricular wall thickness. Right ventricular systolic function is  normal.   Left Atrium: Left atrial size was moderately dilated.   Right Atrium: Right atrial size was normal in size.   Pericardium: There is no evidence of pericardial effusion.   Mitral Valve: The mitral valve is abnormal. There is mild thickening of the mitral valve leaflet(s). Trivial mitral valve regurgitation. No evidence of mitral valve stenosis.   Tricuspid Valve: The tricuspid valve is normal in structure. Tricuspid valve regurgitation is mild . No evidence of tricuspid stenosis.   Aortic Valve: The aortic valve is tricuspid. Aortic valve regurgitation is not visualized. No aortic stenosis is present.   Pulmonic Valve: The pulmonic valve was normal in structure. Pulmonic valve regurgitation is mild. No evidence of pulmonic stenosis.   Aorta: The aortic root is normal in size and structure.   Venous: The inferior vena cava is normal in size with greater than 50% respiratory variability, suggesting right atrial pressure of 3 mmHg.   IAS/Shunts: The interatrial septum was not  well visualized.    Recent Labs: 11/29/2021: Hemoglobin 12.1; Platelets 238 12/02/2021: ALT 29; BUN 5; Creatinine, Ser 0.73; Potassium 3.8; Sodium 136  Recent Lipid Panel    Component Value Date/Time   CHOL 188 02/01/2017 1714   TRIG 157 (H) 02/01/2017 1714   HDL 49 02/01/2017 1714   CHOLHDL 3.8 02/01/2017 1714   LDLCALC 108 (H) 02/01/2017 1714    Physical Exam:    VS:  BP (!) 146/92   Pulse 76   Ht '5\' 6"'$  (1.676 m)   Wt 258 lb 3.2 oz (117.1 kg)   LMP 10/08/2021   SpO2 99%   BMI 41.67 kg/m     Wt Readings from Last 3 Encounters:  03/19/22 258 lb 3.2 oz (117.1 kg)  01/01/22 250 lb (113.4 kg)  12/02/21 254 lb 6.6 oz (115.4 kg)     GEN: Well nourished, well developed in no acute distress HEENT: Normal NECK: No JVD; No carotid bruits LYMPHATICS: No  lymphadenopathy CARDIAC: S1S2 noted,RRR, no murmurs, rubs, gallops RESPIRATORY:  Clear to auscultation without rales, wheezing or rhonchi  ABDOMEN: Soft, non-tender, non-distended, +bowel sounds, no guarding. EXTREMITIES: No edema, No cyanosis, no clubbing MUSCULOSKELETAL:  No deformity  SKIN: Warm and dry NEUROLOGIC:  Alert and oriented x 3, non-focal PSYCHIATRIC:  Normal affect, good insight  ASSESSMENT:    1. Primary hypertension   2. Mixed hyperlipidemia   3. Hypertriglyceridemia   4. Morbid obesity ([place])    PLAN:    She has had great improvement on hydrochlorothiazide blood pressure though has not gotten less than 130/80 which is our target.  We will add amlodipine 5 mg daily.  She has had some likely intolerance in the past but she is willing to try the amlodipine.  If she does develop intolerance or any side effect we will plan to transition her to an ARB which will be starting with valsartan 40 mg daily.  We will refer her to our Pharm.D. weight loss clinic.  The patient is in agreement with the above plan. The patient left the office in stable condition.  The patient will follow up in   Medication Adjustments/Labs and Tests Ordered: Current medicines are reviewed at length with the patient today.  Concerns regarding medicines are outlined above.  Orders Placed This Encounter  Procedures   AMB Referral to [facility] Pharm-D   Meds ordered this encounter  Medications   amLODipine (NORVASC) 5 MG tablet    Sig: Take 1 tablet (5 mg total) by mouth daily.    Dispense:  90 tablet    Refill:  3    Patient Instructions  Medication Instructions:  Your physician has recommended you make the following change in your medication:   START Amlodipine 5 mg tablet by mouth ONCE daily  *If you need a refill on your cardiac medications before your next appointment, please call your pharmacy*   Lab Work: None  If you have labs (blood work) drawn today and your tests are  completely normal, you will receive your results only by: [place] (if you have MyChart) OR A paper copy in the mail If you have any lab test that is abnormal or we need to change your treatment, we will call you to review the results.   Testing/Procedures: none   Follow-Up: At [facility], you and your health needs are our priority.  As part of our continuing mission to provide you with exceptional heart care, we have created designated Provider Care Teams.  These Care Teams include your primary Cardiologist (physician) and Advanced Practice Providers (APPs -  Physician Assistants and Nurse Practitioners) who all work together to provide you with the care you need, when you need it.  We recommend signing up for the patient portal called "MyChart".  Sign up information is provided on this After Visit Summary.  MyChart is used to connect with patients for Virtual Visits (Telemedicine).  Patients are able to view lab/test results, encounter notes, upcoming appointments, etc.  Non-urgent messages can be sent to your provider as well.   To learn more about what you can do with MyChart, go to [email or web address].    Your next appointment:   6 month(s)  The format for your next appointment:   In Person  Provider:   Berniece Salines, DO     Other Instructions Dr. Harriet Masson has Referred you to our [facility] PharmD - with Weight Management.  Important Information About Sugar         Adopting a Healthy Lifestyle.  Know what a healthy weight is for you (roughly BMI <25) and aim to maintain this   Aim for 7+ servings of fruits and vegetables daily   65-80+ fluid ounces of water or unsweet tea for healthy kidneys   Limit to max 1 drink of alcohol per day; avoid smoking/tobacco   Limit animal fats in diet for cholesterol and heart health - choose grass fed whenever available   Avoid highly processed foods, and foods high in saturated/trans fats   Aim for low stress -  take time to unwind and care for your mental health   Aim for 150 min of moderate intensity exercise weekly for heart health, and weights twice weekly for bone health   Aim for 7-9 hours of sleep daily   When it comes to diets, agreement about the perfect plan isnt easy to find, even among the experts. Experts at the [place] developed an idea known as the Healthy Eating Plate. Just imagine a plate divided into logical, healthy portions.   The emphasis is on diet quality:   Load up on vegetables and fruits - one-half of your plate: Aim for color and variety, and remember that potatoes dont count.   Go for whole grains - one-quarter of your plate: Whole wheat, barley, wheat berries, quinoa, oats, brown rice, and foods made with them. If you want pasta, go with whole wheat pasta.   Protein power - one-quarter of your plate: Fish, chicken, beans, and nuts are all healthy, versatile protein sources. Limit red meat.   The diet, however, does go beyond the plate, offering a few other suggestions.   Use healthy plant oils, such as olive, canola, soy, corn, sunflower and peanut. Check the labels, and avoid partially hydrogenated oil, which have unhealthy trans fats.   If youre thirsty, drink water. Coffee and tea are good in moderation, but skip sugary drinks and limit milk and dairy products to one or two daily servings.   The type of carbohydrate in the diet is more important than the amount. Some sources of carbohydrates, such as vegetables, fruits, whole grains, and beans-are healthier than others.   Finally, stay active  Signed, Berniece Salines, DO  03/19/2022 2:20 PM    [place]

## 2022-04-30 ENCOUNTER — Ambulatory Visit: Payer: No Typology Code available for payment source

## 2022-05-07 ENCOUNTER — Telehealth: Payer: Self-pay | Admitting: Pharmacist

## 2022-05-07 ENCOUNTER — Ambulatory Visit: Payer: No Typology Code available for payment source | Attending: Internal Medicine | Admitting: Student

## 2022-05-07 NOTE — Telephone Encounter (Signed)
Applied for [employer] (Key: [id number]) Appeal submitted  Insurance preferred GLP-1 is [place]

## 2022-05-07 NOTE — Patient Instructions (Signed)
Will apply for Prior Authorization for [facility] to your insurance will inform you upon approval and send prescription to your preferred pharmacy GLP1 Agonist Titration Plan:  Will plan to follow the titration plan as below, pending patient is tolerating each dose before increasing to the next. Can slow titration if needed for tolerability.    Wegovy:  -Month 1: Inject Wegovy 0.25 mg once weekly x 4 weeks -Month 2: Inject Wegovy 0.5 mg once weekly x 4 weeks -Month 3: Inject Wegovy 1 mg once weekly x 4 weeks -Month 4: Inject DVOUZH 1.'7mg'$  SQ once weekly x 4 weeks -Month 5+: Inject Wegovy 2.'4mg'$  SQ once weekly

## 2022-05-07 NOTE — Progress Notes (Signed)
HPI: [name] is a 46 y.o. female patient referred to pharmacy clinic by Dr. Harriet Masson to initiate weight loss therapy with GLP1-RA.  Most recent BMI 42. Patient is here today to discuss weight loss medication options. She has been trying different diet and regular physical activity with out having successful results. She had tried keto diet in the past it did not helped. She try to avoid lunch to cut down on calories intake. 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 Being single mother it is hard to balance everything and she has to order takeouts at least 2-3 nights per week. However she makes healthy choices and avoid eating fatty deep fried processed food. After her Ectopic pregnancy few months ago her weight is out control and she started having shoulder pain which was not here before.  Confirmed patient not pregnant or planing to be pregnant and no personal or family history of medullary thyroid carcinoma (MTC) or Multiple Endocrine Neoplasia syndrome type 2 (MEN 2).    Diet:  low salt, low carb  -Breakfast: banana, oatmeal- buy low carb breakfast -Lunch: avoid -Dinner: baked or grilled meat, vegetables -Snacks: butter pop corn  -Drinks: water, orange juice avoids soda Eat out 2-3 times per week    Exercise: going to [facility]- walking treadmill 45-60 min resistance exercise 15-20 min 2 times motivated to start going 3-4 days instead.    Family History: mother: diabetes, high cholesterol, stroke, HF, cancer- non-hodgkin lymphoma sister: pre-diabetes, hypertension, cholesterol   Social History:  Alcohol: none Smoking; never Labs: Lab Results  Component Value Date   HGBA1C 5.5 02/01/2017    Wt Readings from Last 1 Encounters:  03/19/22 258 lb 3.2 oz (117.1 kg)    BP Readings from Last 1 Encounters:  03/19/22 (!) 146/92   Pulse Readings from Last 1 Encounters:  03/19/22 76       Component Value Date/Time    CHOL 188 02/01/2017 1714   TRIG 157 (H) 02/01/2017 1714   HDL 49 02/01/2017 1714   CHOLHDL 3.8 02/01/2017 1714   [place] 108 (H) 02/01/2017 1714    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   Gestational diabetes    Hypertension    no meds now   Mild or unspecified pre-eclampsia, with delivery 06/07/2013   Morbid obesity ([place])    Postpartum care following cesarean delivery - twins (2/20) 06/07/2013    Current Outpatient Medications on File Prior to Visit  Medication Sig Dispense Refill   amLODipine (NORVASC) 5 MG tablet Take 1 tablet (5 mg total) by mouth daily. 90 tablet 3   hydrochlorothiazide (HYDRODIURIL) 25 MG tablet Take 1 tablet (25 mg total) by mouth daily. 90 tablet 3   No current facility-administered medications on file prior to visit.    No Known Allergies  Assessment/Plan   Morbid obesity (HCC) Assessment/Plan: Current BMI 42 Wt 258lbs  Trying different low calories diet with regular physical activities but unable to achieve successful result  Currently not on meds that may affect weight Patient wants to try GLP1-RA for weight loss along with low calories diet and 3-4 days of 60 min regular physical activity  Will apply for prior authorization for insurance preferred GLP1; upon approval will inform patient via [place], Pharm.D [facility] of [facility] [address],  [place],  [address]  Phone: [phone]; Fax: [phone]

## 2022-05-07 NOTE — Assessment & Plan Note (Addendum)
Assessment/Plan: Current BMI 42 Wt 258lbs  Trying different low calories diet with regular physical activities but unable to achieve successful result  Currently not on meds that may affect weight Patient wants to try GLP1-RA for weight loss along with low calories diet and 3-4 days of 60 min regular physical activity  Will apply for prior authorization for insurance preferred GLP1; upon approval will inform patient via MyChart

## 2022-05-08 LAB — HEMOGLOBIN A1C
Est. average glucose Bld gHb Est-mCnc: 131 mg/dL
Hgb A1c MFr Bld: 6.2 % — ABNORMAL HIGH (ref 4.8–5.6)

## 2022-05-10 ENCOUNTER — Ambulatory Visit: Payer: No Typology Code available for payment source | Admitting: Cardiology

## 2022-05-28 ENCOUNTER — Ambulatory Visit: Payer: No Typology Code available for payment source | Attending: Cardiology | Admitting: Cardiology

## 2022-05-28 ENCOUNTER — Encounter: Payer: Self-pay | Admitting: Cardiology

## 2022-05-28 VITALS — BP 180/118 | HR 85 | Ht 68.0 in | Wt 257.0 lb

## 2022-05-28 DIAGNOSIS — I10 Essential (primary) hypertension: Secondary | ICD-10-CM

## 2022-05-28 DIAGNOSIS — E782 Mixed hyperlipidemia: Secondary | ICD-10-CM | POA: Diagnosis not present

## 2022-05-28 MED ORDER — DILTIAZEM HCL ER 180 MG PO TB24: 180.0000 mg | ORAL_TABLET | Freq: Every day | ORAL | 3 refills | Status: DC

## 2022-05-28 NOTE — Progress Notes (Unsigned)
Cardiology Office Note:    Date:  05/31/2022   ID:  [name], DOB [birth year], MRN [id number]  PCP:  Patient, No Pcp Per  Cardiologist:  Berniece Salines, DO  Electrophysiologist:  None   Referring MD: No ref. provider found   " I am doing ok"  History of Present Illness:    [name] is a 47 y.o. female with a hx of gestational diabetes, hypertension, morbid obesity here today for follow-up visit.  Since I saw the patient she has seen our pharmacy colleagues.  Unfortunately she has not been taking the medication as advised for her high blood pressure.  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   Gestational diabetes    Hypertension    no meds now   Mild or unspecified pre-eclampsia, with delivery 06/07/2013   Morbid obesity [facility])    Postpartum care following cesarean delivery - twins (2/20) 06/07/2013    Past Surgical History:  Procedure Laterality Date   CESAREAN SECTION N/A 06/05/2013   Procedure: Primary CESAREAN SECTION; Twins;  Surgeon: Lovenia Kim, MD;  Location: [facility];  Service: Obstetrics;  Laterality: N/A;  EDD: 06/30/13   PARTIAL MASTECTOMY WITH NEEDLE LOCALIZATION  03/05/2012   Procedure: PARTIAL MASTECTOMY WITH NEEDLE LOCALIZATION;  Surgeon: Marcello Moores A. Cornett, MD;  Location: [place];  Service: General;  Laterality: Right;   reconstructive surgery  07/24/1976 &1992    Current Medications: Current Meds  Medication Sig   diltiazem (CARDIZEM LA) 180 MG 24 hr tablet Take 1 tablet (180 mg total) by mouth daily.   hydrochlorothiazide (HYDRODIURIL) 25 MG tablet Take 1 tablet (25 mg total) by mouth daily.   [DISCONTINUED] amLODipine (NORVASC) 5 MG tablet Take 1 tablet (5 mg total) by mouth daily.     Allergies:   Patient has no known allergies.   Social History   Socioeconomic History   Marital status: Single    Spouse name: Not on file   Number of children: Not on file   Years of  education: Not on file   Highest education level: Not on file  Occupational History   Not on file  Tobacco Use   Smoking status: Former    Years: 10.00    Types: Cigarettes    Quit date: 10/03/2012    Years since quitting: 9.6   Smokeless tobacco: Never   Tobacco comments:    1 pack a week  Vaping Use   Vaping Use: Never used  Substance and Sexual Activity   Alcohol use: Yes    Comment: social   Drug use: No   Sexual activity: Yes    Birth control/protection: Condom  Other Topics Concern   Not on file  Social History Narrative   Not on file   Social Determinants of Health   Financial Resource Strain: Not on file  Food Insecurity: Not on file  Transportation Needs: Not on file  Physical Activity: Not on file  Stress: Not on file  Social Connections: Not on file     Family History: The patient's family history includes Cancer in her mother; Diabetes in her mother; Healthy in her father; Heart disease in her mother; High Cholesterol in her mother; Hypertension in her mother; Stroke in her mother.  ROS:   Review of Systems  Constitution: Negative for decreased appetite, fever and weight gain.  HENT: Negative for congestion, ear discharge, hoarse voice and sore throat.   Eyes: Negative  for discharge, redness, vision loss in right eye and visual halos.  Cardiovascular: Negative for chest pain, dyspnea on exertion, leg swelling, orthopnea and palpitations.  Respiratory: Negative for cough, hemoptysis, shortness of breath and snoring.   Endocrine: Negative for heat intolerance and polyphagia.  Hematologic/Lymphatic: Negative for bleeding problem. Does not bruise/bleed easily.  Skin: Negative for flushing, nail changes, rash and suspicious lesions.  Musculoskeletal: Negative for arthritis, joint pain, muscle cramps, myalgias, neck pain and stiffness.  Gastrointestinal: Negative for abdominal pain, bowel incontinence, diarrhea and excessive appetite.  Genitourinary: Negative  for decreased libido, genital sores and incomplete emptying.  Neurological: Negative for brief paralysis, focal weakness, headaches and loss of balance.  Psychiatric/Behavioral: Negative for altered mental status, depression and suicidal ideas.  Allergic/Immunologic: Negative for HIV exposure and persistent infections.    EKGs/Labs/Other Studies Reviewed:    The following studies were reviewed today:   EKG:  None today   TTE 01/01/2022 IMPRESSIONS     1. Left ventricular ejection fraction, by estimation, is 60 to 65%. The left ventricle has normal function. The left ventricle has no regional wall motion abnormalities. There is mild left ventricular hypertrophy.  Left ventricular diastolic parameters were normal.   2. Right ventricular systolic function is normal. The right ventricular size is normal.   3. Left atrial size was moderately dilated.   4. Cannot r/o PFO.   5. The mitral valve is abnormal. Trivial mitral valve regurgitation. No  evidence of mitral stenosis.   6. The aortic valve is tricuspid. Aortic valve regurgitation is not  visualized. No aortic stenosis is present.   7. The inferior vena cava is normal in size with greater than 50%  respiratory variability, suggesting right atrial pressure of 3 mmHg.   FINDINGS   Left Ventricle: Left ventricular ejection fraction, by estimation, is 60  to 65%. The left ventricle has normal function. The left ventricle has no  regional wall motion abnormalities. The left ventricular internal cavity  size was normal in size. There is   mild left ventricular hypertrophy. Left ventricular diastolic parameters  were normal.   Right Ventricle: The right ventricular size is normal. No increase in  right ventricular wall thickness. Right ventricular systolic function is  normal.   Left Atrium: Left atrial size was moderately dilated.   Right Atrium: Right atrial size was normal in size.   Pericardium: There is no evidence of  pericardial effusion.   Mitral Valve: The mitral valve is abnormal. There is mild thickening of the mitral valve leaflet(s). Trivial mitral valve regurgitation. No evidence of mitral valve stenosis.   Tricuspid Valve: The tricuspid valve is normal in structure. Tricuspid valve regurgitation is mild . No evidence of tricuspid stenosis.   Aortic Valve: The aortic valve is tricuspid. Aortic valve regurgitation is not visualized. No aortic stenosis is present.   Pulmonic Valve: The pulmonic valve was normal in structure. Pulmonic valve regurgitation is mild. No evidence of pulmonic stenosis.   Aorta: The aortic root is normal in size and structure.   Venous: The inferior vena cava is normal in size with greater than 50% respiratory variability, suggesting right atrial pressure of 3 mmHg.   IAS/Shunts: The interatrial septum was not well visualized.    Recent Labs: 11/29/2021: Hemoglobin 12.1; Platelets 238 12/02/2021: ALT 29; BUN 5; Creatinine, Ser 0.73; Potassium 3.8; Sodium 136  Recent Lipid Panel    Component Value Date/Time   CHOL 188 02/01/2017 1714   TRIG 157 (H) 02/01/2017 1714  HDL 49 02/01/2017 1714   CHOLHDL 3.8 02/01/2017 1714   LDLCALC 108 (H) 02/01/2017 1714    Physical Exam:    VS:  BP (!) 180/118 (BP Location: Right Arm)   Pulse 85   Ht 5' 8"$  (1.727 m)   Wt 116.6 kg   LMP 10/08/2021   SpO2 99%   BMI 39.08 kg/m     Wt Readings from Last 3 Encounters:  05/28/22 116.6 kg  03/19/22 117.1 kg  01/01/22 113.4 kg     GEN: Well nourished, well developed in no acute distress HEENT: Normal NECK: No JVD; No carotid bruits LYMPHATICS: No lymphadenopathy CARDIAC: S1S2 noted,RRR, no murmurs, rubs, gallops RESPIRATORY:  Clear to auscultation without rales, wheezing or rhonchi  ABDOMEN: Soft, non-tender, non-distended, +bowel sounds, no guarding. EXTREMITIES: No edema, No cyanosis, no clubbing MUSCULOSKELETAL:  No deformity  SKIN: Warm and dry NEUROLOGIC:  Alert and  oriented x 3, non-focal PSYCHIATRIC:  Normal affect, good insight  ASSESSMENT:    1. Primary hypertension   2. Morbid obesity ([place])   3. Mixed hyperlipidemia     PLAN:    She is hypertensive in the office today.  Manually taken by me as well.  Unfortunately she is not taking her antihypertensive medication.  She is fully aware of the adverse outcomes which include heart attacks, strokes as well as heart failure and tells me that she is going to try again to restart taking it.  I discussed with her about her monitor result which show nonsustained ventricular tachycardia which she is symptomatic.  Will stop the amlodipine, use Cardizem 180 mg in the patient.  As well as continue the hydrochlorothiazide.  She is apprehensive but I am really hoping that she actually to start taking the medication for [place] to be able to control her blood pressure.   The patient understands the need to lose weight with diet and exercise. We have discussed specific strategies for this.     The patient is in agreement with the above plan. The patient left the office in stable condition.  The patient will follow up in   Medication Adjustments/Labs and Tests Ordered: Current medicines are reviewed at length with the patient today.  Concerns regarding medicines are outlined above.  Orders Placed This Encounter  Procedures   AMB Referral to [facility] Pharm-D   Meds ordered this encounter  Medications   diltiazem (CARDIZEM LA) 180 MG 24 hr tablet    Sig: Take 1 tablet (180 mg total) by mouth daily.    Dispense:  90 tablet    Refill:  3    Patient Instructions  Medication Instructions:  Your physician has recommended you make the following change in your medication:  STOP: Amlodipine START: Cardizem 180 mg once daily  Please follow up with Pharm D in 4 weeks.  *If you need a refill on your cardiac medications before your next appointment, please call your pharmacy*   Lab Work: None If you have  labs (blood work) drawn today and your tests are completely normal, you will receive your results only by: [place] (if you have MyChart) OR A paper copy in the mail If you have any lab test that is abnormal or we need to change your treatment, we will call you to review the results.   Testing/Procedures: None   Follow-Up: At [facility], you and your health needs are our priority.  As part of our continuing mission to provide you with exceptional heart care, we  have created designated Provider Care Teams.  These Care Teams include your primary Cardiologist (physician) and Advanced Practice Providers (APPs -  Physician Assistants and Nurse Practitioners) who all work together to provide you with the care you need, when you need it.   Your next appointment:   16 week(s)  Provider:   Berniece Salines, DO    Adopting a Healthy Lifestyle.  Know what a healthy weight is for you (roughly BMI <25) and aim to maintain this   Aim for 7+ servings of fruits and vegetables daily   65-80+ fluid ounces of water or unsweet tea for healthy kidneys   Limit to max 1 drink of alcohol per day; avoid smoking/tobacco   Limit animal fats in diet for cholesterol and heart health - choose grass fed whenever available   Avoid highly processed foods, and foods high in saturated/trans fats   Aim for low stress - take time to unwind and care for your mental health   Aim for 150 min of moderate intensity exercise weekly for heart health, and weights twice weekly for bone health   Aim for 7-9 hours of sleep daily   When it comes to diets, agreement about the perfect plan isnt easy to find, even among the experts. Experts at the [employer] developed an idea known as the Healthy Eating Plate. Just imagine a plate divided into logical, healthy portions.   The emphasis is on diet quality:   Load up on vegetables and fruits - one-half of your plate: Aim for color and  variety, and remember that potatoes dont count.   Go for whole grains - one-quarter of your plate: Whole wheat, barley, wheat berries, quinoa, oats, brown rice, and foods made with them. If you want pasta, go with whole wheat pasta.   Protein power - one-quarter of your plate: Fish, chicken, beans, and nuts are all healthy, versatile protein sources. Limit red meat.   The diet, however, does go beyond the plate, offering a few other suggestions.   Use healthy plant oils, such as olive, canola, soy, corn, sunflower and peanut. Check the labels, and avoid partially hydrogenated oil, which have unhealthy trans fats.   If youre thirsty, drink water. Coffee and tea are good in moderation, but skip sugary drinks and limit milk and dairy products to one or two daily servings.   The type of carbohydrate in the diet is more important than the amount. Some sources of carbohydrates, such as vegetables, fruits, whole grains, and beans-are healthier than others.   Finally, stay active  Signed, Berniece Salines, DO  05/31/2022 7:41 AM    [place]

## 2022-05-28 NOTE — Patient Instructions (Addendum)
Medication Instructions:  Your physician has recommended you make the following change in your medication:  STOP: Amlodipine START: Cardizem 180 mg once daily  Please follow up with Pharm D in 4 weeks.  *If you need a refill on your cardiac medications before your next appointment, please call your pharmacy*   Lab Work: None If you have labs (blood work) drawn today and your tests are completely normal, you will receive your results only by: [place] (if you have MyChart) OR A paper copy in the mail If you have any lab test that is abnormal or we need to change your treatment, we will call you to review the results.   Testing/Procedures: None   Follow-Up: At [facility], you and your health needs are our priority.  As part of our continuing mission to provide you with exceptional heart care, we have created designated Provider Care Teams.  These Care Teams include your primary Cardiologist (physician) and Advanced Practice Providers (APPs -  Physician Assistants and Nurse Practitioners) who all work together to provide you with the care you need, when you need it.   Your next appointment:   16 week(s)  Provider:   Berniece Salines, DO

## 2022-06-25 ENCOUNTER — Ambulatory Visit: Payer: No Typology Code available for payment source

## 2022-06-25 ENCOUNTER — Other Ambulatory Visit (HOSPITAL_COMMUNITY): Payer: Self-pay

## 2022-06-25 ENCOUNTER — Telehealth: Payer: Self-pay

## 2022-06-25 ENCOUNTER — Encounter: Payer: Self-pay | Admitting: Cardiology

## 2022-06-25 NOTE — Telephone Encounter (Signed)
Pharmacy Patient Advocate Encounter   Received notification from [facility] that prior authorization for Ozempic is needed.    PA submitted on 06/25/22 [place] [id number] Status is pending  Karie Soda, [place] Patient Advocate Specialist Direct Number: [phone] Fax: [phone]

## 2022-06-26 NOTE — Telephone Encounter (Signed)
No problem, I didn't expect it to be covered since she does not have type 2 diabetes. This does not need to be appealed, I'll let pt know that insurance does not cover, thanks!

## 2022-06-26 NOTE — Telephone Encounter (Signed)
Pharmacy Patient Advocate Encounter  Received notification from [facility] that the request for prior authorization for [facility] has been denied due to STEP THERAPY PROTOCOL NOT MET   [facility], PLEASE ADVISE  Karie Soda, [place] Patient Advocate Specialist Direct Number: [phone] Fax: [phone]

## 2022-06-26 NOTE — Telephone Encounter (Signed)
Looks like [name] has submitted prior auth for [employer] - pt does not have DM which Ozempic is FDA approved for. Typically insurance does not cover for prediabetes but can wait for plan determination.

## 2022-06-26 NOTE — Telephone Encounter (Signed)
PA submitted per pt request as she wanted to try Ozempic, not currently taking med. She has preDM but not DM, unlikely that insurance will cover but will await PA determination.

## 2022-07-09 ENCOUNTER — Ambulatory Visit: Payer: No Typology Code available for payment source | Attending: Cardiovascular Disease

## 2022-07-09 NOTE — Progress Notes (Deleted)
   Office Visit    Patient Name: [name] Date of Encounter: 07/09/2022  Primary Care Provider:  Patient, No Pcp Per Primary Cardiologist:  Berniece Salines, DO  Chief Complaint    Hypertension  Significant Past Medical History   hyperlipidemia   Ventricular tachycardia Recently found on monitor - symptomatic, now on diltiazem 180  obesity Not able to get GLP1 for weight loss    No Known Allergies  History of Present Illness    [name] is a 47 y.o. female patient of Dr Harriet Masson, in the office today for hypertension management.  Patient has been seen in PharmD clinic by Cammy Copa earlier this year for weight loss management.  She had trouble getting medication coverage and has not taken a GLP1 to date.   For her blood pressure she is on diltiazem 180 mg and HCTZ 25 mg.  Apparently there is some apprehension about taking BP medications and had not been on them at her last visit with Dr. Harriet Masson (May 28, 2022).   Dr. Harriet Masson reviewed adverse outcomes associated with uncontrolled hypertension and patient stated she would restart the medications.  Today she ins in the office for follow up.    Blood Pressure Goal:  130/80  Current Medications:  diltiazem 180 mg qd, hctz 25 mg qd  Previously tried:  NKDA  Family Hx:   mother: diabetes, high cholesterol, stroke, HF, cancer- non-hodgkin lymphoma;  sister: pre-diabetes, hypertension, cholesterol   Social Hx:      Tobacco: no  Alcohol: no  Caffeine:  Diet:      Exercise:   Home BP readings:      Adherence Assessment  Do you ever forget to take your medication? [] Yes [] No  Do you ever skip doses due to side effects? [] Yes [] No  Do you have trouble affording your medicines? [] Yes [] No  Are you ever unable to pick up your medication due to transportation difficulties? [] Yes [] No  Do you ever stop taking your medications because you don't believe they are helping? [] Yes [] No  Do you check your weight daily? [] Yes [] No    Adherence strategy: ***  Barriers to obtaining medications: ***     Accessory Clinical Findings    Lab Results  Component Value Date   CREATININE 0.73 12/02/2021   BUN 5 (L) 12/02/2021   NA 136 12/02/2021   K 3.8 12/02/2021   CL 106 12/02/2021   CO2 24 12/02/2021   Lab Results  Component Value Date   ALT 29 12/02/2021   AST 27 12/02/2021   ALKPHOS 46 12/02/2021   BILITOT 0.4 12/02/2021   Lab Results  Component Value Date   HGBA1C 6.2 (H) 05/07/2022    Home Medications    Current Outpatient Medications  Medication Sig Dispense Refill   diltiazem (CARDIZEM LA) 180 MG 24 hr tablet Take 1 tablet (180 mg total) by mouth daily. 90 tablet 3   hydrochlorothiazide (HYDRODIURIL) 25 MG tablet Take 1 tablet (25 mg total) by mouth daily. 90 tablet 3   No current facility-administered medications for this visit.     No BP recorded.  {Refresh Note OR Click here to enter BP  :1}***   Assessment & Plan    No problem-specific Assessment & Plan notes found for this encounter.   Tommy Medal PharmD CPP [place]  [address] [place], [place] [address] [phone]

## 2022-08-20 ENCOUNTER — Ambulatory Visit: Payer: No Typology Code available for payment source | Attending: Cardiovascular Disease | Admitting: Student

## 2022-08-20 ENCOUNTER — Encounter: Payer: Self-pay | Admitting: Student

## 2022-08-20 ENCOUNTER — Telehealth: Payer: Self-pay | Admitting: Licensed Clinical Social Worker

## 2022-08-20 VITALS — BP 158/108 | HR 77

## 2022-08-20 DIAGNOSIS — I10 Essential (primary) hypertension: Secondary | ICD-10-CM | POA: Diagnosis not present

## 2022-08-20 NOTE — Telephone Encounter (Signed)
H&V Care Navigation CSW Progress Note  Clinical Social Worker contacted patient by phone to f/u after appt with pharmacy. Per PharmD there are challenges with financially affording her medications. Pt is single mother, inquiring about assistance. LCSW shared since pt has commercial insurance she can try and fill three months at a time, sometimes there is a discount. Also pt cardizem is more affordable using cash price and goodrx. Also recommended pt screen for Medicaid. Unfortunately when I called pt today she didn't answer and voicemail full. I let PharmD know the above. Pt called back and she gave her DSS number, I also sent her contact to Cathlyn Parsons, caseworker with DSS to see if she can be screened for eligibility also.  Patient is participating in a Managed Medicaid Plan:  No, UHC commercial plan  SDOH Screenings   Tobacco Use: Medium Risk (05/28/2022)   Octavio Graves, MSW, LCSW Clinical Social Worker II [facility] Heart/Vascular Care Navigation  [phone]- work cell phone (preferred) [phone]- desk phone

## 2022-08-20 NOTE — Assessment & Plan Note (Signed)
Assessment: BP is uncontrolled in office 1st BP 162/106 mmHg 2nd measurement 158/108 HR 77 (goal <130/80). Medication adherence has improved only missed Diltiazem twice during last week  Tolerates HTN mediations well without any side effects Denies SOB, palpitation, chest pain, headaches,or swelling Does not check BP at home ( do not have home monitor) Reiterated the importance of regular exercise and low salt diet   Plan:  Patient does not want add or change any of her medications  Start using pill box with pill reminder on the phone to improve adherence  Use GoodRX coupon to get diltiazem at lower cost next time  Continue taking diltiazem 180 mg daily and HCTZ 25 mg daily  Patient to keep record of BP readings with heart rate and report to [place] at the next visit Appointment with Dr.Tobb in 1 month Provided DSS caseworker phone # [phone] to see if she would qualify for Medicaid

## 2022-08-20 NOTE — Progress Notes (Signed)
Patient ID: [name]                 DOB: 05-Jun-1975                      MRN: [id number]      HPI: [name] is a 47 y.o. female referred by Dr. Servando Salina to HTN clinic. PMH is significant for gestational diabetes, hypertension, morbid obesity, medication non-adherence   Patient presented today for BP follow up. Patient has not been checking her BP at home at [facility] GYN visit she was told it was good. (120/75- something like that). She is very disappointment that her insurance does not cover GLP1 for weight loss, she has been gaining even more weight after her IUD placement. She may have skipped taking her Diltiazem couple times during last week. Her 1 month supply for diltiazem costing her around $40 which can be cost prohibitive sometime. Found out it would cost only $26 through [place] next time use [employer]. She has been taking her HCTZ regularly. She is single mother without lot of help so it is hard for her. Her diet has improved a lot. Was eating out 3-4 times per week but now eats out once week. She cooks without salt and doesn't add salt to her cooked food. As weather is nice she try to go for walks and does 1 mile of bike. She thinks she could be more consistent with her exercise schedule. Patient does not want to add any new medication or change the dose of current medication  for BP today. She sees Dr.Tobb in 1 month if BP remains elevated she would go on new BP medication  Current HTN meds: diltiazem 180 mg daily, HCTZ 25 mg daily Previously tried: amlodipine 5 mg daily  BP goal: <130/80 HYPERTENSION CONTROL Vitals:   08/20/22 1354 08/20/22 1356  BP: (!) 162/106 (!) 158/108    The patient's blood pressure is elevated above target today.  In order to address the patient's elevated BP: Blood pressure will be monitored at home to determine if medication changes need to be made.     Family History:  Mother: CAD, HF, MI First cousin AV valve replaced  Maternal uncle : MI Maternal  aunt : MI  Social History:  Alcohol: none Smoking: quit Jan 2024, smoked for 5 years 1 pack per week    Exercise: walking 2-3 times week, bike - 1 mile     Home BP readings: does not check at home   Wt Readings from Last 3 Encounters:  05/28/22 257 lb (116.6 kg)  03/19/22 258 lb 3.2 oz (117.1 kg)  01/01/22 250 lb (113.4 kg)   BP Readings from Last 3 Encounters:  08/20/22 (!) 158/108  05/28/22 (!) 180/118  03/19/22 (!) 146/92   Pulse Readings from Last 3 Encounters:  08/20/22 77  05/28/22 85  03/19/22 76    Renal function: CrCl cannot be calculated (Patient's most recent lab result is older than the maximum 21 days allowed.).  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 90 tablet 3   hydrochlorothiazide (HYDRODIURIL) 25 MG tablet Take 1 tablet (25 mg total) by mouth daily. 90 tablet 3   No current facility-administered medications on file prior to visit.    No Known Allergies  Blood pressure (!) 158/108, pulse 77, last menstrual period 10/08/2021, SpO2 100 %.   Assessment/Plan:  1. Hypertension -  Hypertension Assessment: BP is uncontrolled in office 1st BP 162/106 mmHg 2nd measurement 158/108 HR 77 (goal <130/80). Medication adherence has improved only missed Diltiazem twice during last week  Tolerates HTN mediations well without any side effects Denies SOB, palpitation, chest pain, headaches,or swelling Does not check BP at home ( do not have home monitor) Reiterated the importance of regular exercise and low salt  diet   Plan:  Patient does not want add or change any of her medications  Start using pill box with pill reminder on the phone to improve adherence  Use [employer] coupon to get diltiazem at lower cost next time  Continue taking diltiazem 180 mg daily and HCTZ 25 mg daily  Patient to keep record of BP readings with heart rate and report to [place] at the next visit Appointment with Dr.Tobb in 1 month Provided DSS caseworker phone # [phone] to see if she would qualify for Medicaid        Thank you  Carmela Hurt, Pharm.D [facility] A Division of [facility] [address], [place], [place] [address]  Phone: [phone]; Fax: [phone]

## 2022-08-20 NOTE — Patient Instructions (Signed)
No changes to medications made by your pharmacist Carmela Hurt, PharmD at today's visit:    Bring your BP cuff and your record of home blood pressures to your next appointment.    HOW TO TAKE YOUR BLOOD PRESSURE AT HOME  Rest 5 minutes before taking your blood pressure.  Don't smoke or drink caffeinated beverages for at least 30 minutes before. Take your blood pressure before (not after) you eat. Sit comfortably with your back supported and both feet on the floor (don't cross your legs). Elevate your arm to heart level on a table or a desk. Use the proper sized cuff. It should fit smoothly and snugly around your bare upper arm. There should be enough room to slip a fingertip under the cuff. The bottom edge of the cuff should be 1 inch above the crease of the elbow. Ideally, take 3 measurements at one sitting and record the average.  Important lifestyle changes to control high blood pressure  Intervention  Effect on the BP  Lose extra pounds and watch your waistline Weight loss is one of the most effective lifestyle changes for controlling blood pressure. If you're overweight or obese, losing even a small amount of weight can help reduce blood pressure. Blood pressure might go down by about 1 millimeter of mercury (mm Hg) with each kilogram (about 2.2 pounds) of weight lost.  Exercise regularly As a general goal, aim for at least 30 minutes of moderate physical activity every day. Regular physical activity can lower high blood pressure by about 5 to 8 mm Hg.  Eat a healthy diet Eating a diet rich in whole grains, fruits, vegetables, and low-fat dairy products and low in saturated fat and cholesterol. A healthy diet can lower high blood pressure by up to 11 mm Hg.  Reduce salt (sodium) in your diet Even a small reduction of sodium in the diet can improve heart health and reduce high blood pressure by about 5 to 6 mm Hg.  Limit alcohol One drink equals 12 ounces of beer, 5 ounces of wine, or  1.5 ounces of 80-proof liquor.  Limiting alcohol to less than one drink a day for women or two drinks a day for men can help lower blood pressure by about 4 mm Hg.   If you have any questions or concerns please use My Chart to send questions or call the office at [phone]

## 2022-09-17 ENCOUNTER — Ambulatory Visit: Payer: No Typology Code available for payment source | Admitting: Cardiology

## 2022-09-17 ENCOUNTER — Telehealth: Payer: Self-pay

## 2022-09-17 NOTE — Telephone Encounter (Signed)
Called pt to see if she could some in sooner. No answer, unable to leave a message, voicemail full.

## 2022-10-16 ENCOUNTER — Other Ambulatory Visit: Payer: Self-pay

## 2022-10-16 ENCOUNTER — Emergency Department (HOSPITAL_COMMUNITY): Payer: No Typology Code available for payment source

## 2022-10-16 ENCOUNTER — Encounter (HOSPITAL_COMMUNITY): Payer: Self-pay

## 2022-10-16 ENCOUNTER — Inpatient Hospital Stay (HOSPITAL_COMMUNITY)
Admission: EM | Admit: 2022-10-16 | Discharge: 2022-10-18 | DRG: 638 | Disposition: A | Discharge status: Home / Self Care | Payer: No Typology Code available for payment source | Attending: Internal Medicine | Admitting: Internal Medicine

## 2022-10-16 DIAGNOSIS — Z823 Family history of stroke: Secondary | ICD-10-CM | POA: Diagnosis not present

## 2022-10-16 DIAGNOSIS — Z6835 Body mass index (BMI) 35.0-35.9, adult: Secondary | ICD-10-CM

## 2022-10-16 DIAGNOSIS — R000 Tachycardia, unspecified: Secondary | ICD-10-CM | POA: Diagnosis present

## 2022-10-16 DIAGNOSIS — E785 Hyperlipidemia, unspecified: Secondary | ICD-10-CM | POA: Diagnosis present

## 2022-10-16 DIAGNOSIS — E861 Hypovolemia: Secondary | ICD-10-CM | POA: Diagnosis present

## 2022-10-16 DIAGNOSIS — Z91148 Patient's other noncompliance with medication regimen for other reason: Secondary | ICD-10-CM

## 2022-10-16 DIAGNOSIS — Z8632 Personal history of gestational diabetes: Secondary | ICD-10-CM

## 2022-10-16 DIAGNOSIS — Z87891 Personal history of nicotine dependence: Secondary | ICD-10-CM | POA: Diagnosis not present

## 2022-10-16 DIAGNOSIS — E1110 Type 2 diabetes mellitus with ketoacidosis without coma: Secondary | ICD-10-CM | POA: Diagnosis not present

## 2022-10-16 DIAGNOSIS — Z794 Long term (current) use of insulin: Secondary | ICD-10-CM | POA: Diagnosis not present

## 2022-10-16 DIAGNOSIS — I10 Essential (primary) hypertension: Secondary | ICD-10-CM | POA: Diagnosis not present

## 2022-10-16 DIAGNOSIS — Z79899 Other long term (current) drug therapy: Secondary | ICD-10-CM

## 2022-10-16 DIAGNOSIS — E669 Obesity, unspecified: Secondary | ICD-10-CM | POA: Diagnosis not present

## 2022-10-16 DIAGNOSIS — E0810 Diabetes mellitus due to underlying condition with ketoacidosis without coma: Secondary | ICD-10-CM

## 2022-10-16 DIAGNOSIS — E876 Hypokalemia: Secondary | ICD-10-CM | POA: Diagnosis present

## 2022-10-16 DIAGNOSIS — Z833 Family history of diabetes mellitus: Secondary | ICD-10-CM

## 2022-10-16 DIAGNOSIS — Z9011 Acquired absence of right breast and nipple: Secondary | ICD-10-CM | POA: Diagnosis not present

## 2022-10-16 DIAGNOSIS — Z7984 Long term (current) use of oral hypoglycemic drugs: Secondary | ICD-10-CM | POA: Diagnosis not present

## 2022-10-16 DIAGNOSIS — Z8249 Family history of ischemic heart disease and other diseases of the circulatory system: Secondary | ICD-10-CM | POA: Diagnosis not present

## 2022-10-16 DIAGNOSIS — N179 Acute kidney failure, unspecified: Secondary | ICD-10-CM | POA: Diagnosis present

## 2022-10-16 DIAGNOSIS — I495 Sick sinus syndrome: Secondary | ICD-10-CM | POA: Diagnosis not present

## 2022-10-16 DIAGNOSIS — E1310 Other specified diabetes mellitus with ketoacidosis without coma: Principal | ICD-10-CM

## 2022-10-16 DIAGNOSIS — E1165 Type 2 diabetes mellitus with hyperglycemia: Secondary | ICD-10-CM | POA: Diagnosis not present

## 2022-10-16 HISTORY — DX: Type 2 diabetes mellitus with ketoacidosis without coma: E11.10

## 2022-10-16 LAB — BASIC METABOLIC PANEL
Anion gap: 24 — ABNORMAL HIGH (ref 5–15)
Anion gap: 18 — ABNORMAL HIGH (ref 5–15)
Anion gap: 14 (ref 5–15)
BUN: 10 mg/dL (ref 6–20)
BUN: 8 mg/dL (ref 6–20)
BUN: 6 mg/dL (ref 6–20)
CO2: 8 mmol/L — ABNORMAL LOW (ref 22–32)
CO2: 9 mmol/L — ABNORMAL LOW (ref 22–32)
CO2: 13 mmol/L — ABNORMAL LOW (ref 22–32)
Calcium: 8.9 mg/dL (ref 8.9–10.3)
Calcium: 8.5 mg/dL — ABNORMAL LOW (ref 8.9–10.3)
Calcium: 8.5 mg/dL — ABNORMAL LOW (ref 8.9–10.3)
Chloride: 102 mmol/L (ref 98–111)
Chloride: 108 mmol/L (ref 98–111)
Chloride: 111 mmol/L (ref 98–111)
Creatinine, Ser: 1.22 mg/dL — ABNORMAL HIGH (ref 0.44–1.00)
Creatinine, Ser: 1.04 mg/dL — ABNORMAL HIGH (ref 0.44–1.00)
Creatinine, Ser: 0.95 mg/dL (ref 0.44–1.00)
GFR, Estimated: 56 mL/min — ABNORMAL LOW (ref >60)
GFR, Estimated: >60 mL/min (ref >60)
GFR, Estimated: >60 mL/min (ref >60)
Glucose, Bld: 380 mg/dL — ABNORMAL HIGH (ref 70–99)
Glucose, Bld: 305 mg/dL — ABNORMAL HIGH (ref 70–99)
Glucose, Bld: 103 mg/dL — ABNORMAL HIGH (ref 70–99)
Potassium: 3.4 mmol/L — ABNORMAL LOW (ref 3.5–5.1)
Potassium: 3.5 mmol/L (ref 3.5–5.1)
Potassium: 2.9 mmol/L — ABNORMAL LOW (ref 3.5–5.1)
Sodium: 134 mmol/L — ABNORMAL LOW (ref 135–145)
Sodium: 135 mmol/L (ref 135–145)
Sodium: 138 mmol/L (ref 135–145)

## 2022-10-16 LAB — I-STAT VENOUS BLOOD GAS, ED
Acid-base deficit: 18 mmol/L — ABNORMAL HIGH (ref 0.0–2.0)
Bicarbonate: 7.5 mmol/L — ABNORMAL LOW (ref 20.0–28.0)
Calcium, Ion: 1.12 mmol/L — ABNORMAL LOW (ref 1.15–1.40)
HCT: 44 % (ref 36.0–46.0)
Hemoglobin: 15 g/dL (ref 12.0–15.0)
O2 Saturation: 98 %
Potassium: 3.3 mmol/L — ABNORMAL LOW (ref 3.5–5.1)
Sodium: 136 mmol/L (ref 135–145)
TCO2: 8 mmol/L — ABNORMAL LOW (ref 22–32)
pCO2, Ven: 18.8 mmHg — CL (ref 44–60)
pH, Ven: 7.211 — ABNORMAL LOW (ref 7.25–7.43)
pO2, Ven: 125 mmHg — ABNORMAL HIGH (ref 32–45)

## 2022-10-16 LAB — CBC
HCT: 46.8 % — ABNORMAL HIGH (ref 36.0–46.0)
Hemoglobin: 14.9 g/dL (ref 12.0–15.0)
MCH: 29.6 pg (ref 26.0–34.0)
MCHC: 31.8 g/dL (ref 30.0–36.0)
MCV: 92.9 fL (ref 80.0–100.0)
Platelets: 191 10*3/uL (ref 150–400)
RBC: 5.04 MIL/uL (ref 3.87–5.11)
RDW: 12.5 % (ref 11.5–15.5)
WBC: 7 10*3/uL (ref 4.0–10.5)
nRBC: 0 % (ref 0.0–0.2)

## 2022-10-16 LAB — HIV ANTIBODY (ROUTINE TESTING W REFLEX): HIV Screen 4th Generation wRfx: NONREACTIVE

## 2022-10-16 LAB — HEMOGLOBIN A1C
Hgb A1c MFr Bld: 12.1 % — ABNORMAL HIGH (ref 4.8–5.6)
Mean Plasma Glucose: 300.57 mg/dL

## 2022-10-16 LAB — GLUCOSE, CAPILLARY
Glucose-Capillary: 120 mg/dL — ABNORMAL HIGH (ref 70–99)
Glucose-Capillary: 179 mg/dL — ABNORMAL HIGH (ref 70–99)

## 2022-10-16 LAB — BETA-HYDROXYBUTYRIC ACID: Beta-Hydroxybutyric Acid: >8 mmol/L — ABNORMAL HIGH (ref 0.05–0.27)

## 2022-10-16 LAB — CBG MONITORING, ED
Glucose-Capillary: 299 mg/dL — ABNORMAL HIGH (ref 70–99)
Glucose-Capillary: 218 mg/dL — ABNORMAL HIGH (ref 70–99)

## 2022-10-16 LAB — HCG, SERUM, QUALITATIVE: Preg, Serum: NEGATIVE

## 2022-10-16 LAB — TROPONIN I (HIGH SENSITIVITY): Troponin I (High Sensitivity): 7 ng/L (ref <18)

## 2022-10-16 LAB — OSMOLALITY: Osmolality: 315 mOsm/kg — ABNORMAL HIGH (ref 275–295)

## 2022-10-16 LAB — D-DIMER, QUANTITATIVE: D-Dimer, Quant: 0.53 ug/mL-FEU — ABNORMAL HIGH (ref 0.00–0.50)

## 2022-10-16 MED ORDER — INSULIN REGULAR(HUMAN) IN NACL 100-0.9 UT/100ML-% IV SOLN: INTRAVENOUS | Status: DC

## 2022-10-16 MED ORDER — DEXTROSE IN LACTATED RINGERS 5 % IV SOLN: INTRAVENOUS | Status: AC

## 2022-10-16 MED ORDER — DILTIAZEM HCL ER COATED BEADS 180 MG PO CP24
180.0000 mg | ORAL_CAPSULE | Freq: Every day | ORAL | Status: DC
  Administered 2022-10-16 – 2022-10-18 (×3): 180 mg via ORAL
  Filled 2022-10-16 (×3): qty 1

## 2022-10-16 MED ORDER — DILTIAZEM HCL ER 180 MG PO TB24
180.0000 mg | ORAL_TABLET | Freq: Every day | ORAL | Status: DC
  Filled 2022-10-16: qty 1

## 2022-10-16 MED ORDER — LACTATED RINGERS IV SOLN: INTRAVENOUS | Status: DC

## 2022-10-16 MED ORDER — DEXTROSE 50 % IV SOLN: 0.0000 mL | INTRAVENOUS | Status: DC | PRN

## 2022-10-16 MED ORDER — SODIUM CHLORIDE 0.9 % IV BOLUS
1000.0000 mL | Freq: Once | INTRAVENOUS | Status: AC
  Administered 2022-10-16: 1000 mL via INTRAVENOUS

## 2022-10-16 MED ORDER — SODIUM CHLORIDE 0.9 % IV BOLUS: 1000.0000 mL | Freq: Once | INTRAVENOUS | Status: DC

## 2022-10-16 MED ORDER — DILTIAZEM HCL ER 240 MG PO TB24: 240.0000 mg | ORAL_TABLET | Freq: Every day | ORAL | 0 refills | Status: DC

## 2022-10-16 MED ORDER — POTASSIUM CHLORIDE 10 MEQ/100ML IV SOLN
10.0000 meq | INTRAVENOUS | Status: AC
  Administered 2022-10-17 (×2): 10 meq via INTRAVENOUS
  Filled 2022-10-16 (×2): qty 100

## 2022-10-16 MED ORDER — ENOXAPARIN SODIUM 60 MG/0.6ML IJ SOSY
50.0000 mg | PREFILLED_SYRINGE | INTRAMUSCULAR | Status: DC
  Administered 2022-10-16 – 2022-10-17 (×2): 50 mg via SUBCUTANEOUS
  Filled 2022-10-16 (×2): qty 0.6

## 2022-10-16 MED ORDER — POTASSIUM CHLORIDE 10 MEQ/100ML IV SOLN
10.0000 meq | INTRAVENOUS | Status: AC
  Administered 2022-10-16 (×4): 10 meq via INTRAVENOUS
  Filled 2022-10-16 (×3): qty 100

## 2022-10-16 MED ORDER — DEXTROSE IN LACTATED RINGERS 5 % IV SOLN: INTRAVENOUS | Status: DC

## 2022-10-16 MED ORDER — IOHEXOL 350 MG/ML SOLN
75.0000 mL | Freq: Once | INTRAVENOUS | Status: AC | PRN
  Administered 2022-10-16: 75 mL via INTRAVENOUS

## 2022-10-16 MED ORDER — POTASSIUM CHLORIDE 10 MEQ/100ML IV SOLN
10.0000 meq | INTRAVENOUS | Status: DC
  Filled 2022-10-16: qty 100

## 2022-10-16 MED ORDER — INSULIN REGULAR(HUMAN) IN NACL 100-0.9 UT/100ML-% IV SOLN
INTRAVENOUS | Status: AC
  Administered 2022-10-16: 15 [IU]/h via INTRAVENOUS
  Administered 2022-10-16: 5 [IU]/h via INTRAVENOUS
  Administered 2022-10-17: 16 [IU]/h via INTRAVENOUS
  Administered 2022-10-17: 5 [IU]/h via INTRAVENOUS
  Filled 2022-10-16 (×2): qty 100

## 2022-10-16 MED ORDER — POTASSIUM CHLORIDE 10 MEQ/100ML IV SOLN: 10.0000 meq | INTRAVENOUS | Status: DC

## 2022-10-16 MED ORDER — LACTATED RINGERS IV BOLUS
20.0000 mL/kg | Freq: Once | INTRAVENOUS | Status: AC
  Administered 2022-10-16: 2332 mL via INTRAVENOUS

## 2022-10-16 NOTE — Hospital Course (Addendum)
  7/3: Reports feeling fine, recently waking up from sleep. Denies palpitations, heart fluttering, fast HR; fast breathing improved; denies abdominal pain; denies dizziness, feeling foggy.  DM Discussed pt's understanding of her diabetes, she reports it is "honestly none." Discussed with pt how we are trying to figure out whether she is Type 1 or Type 2. Discussed function of pancreas re: insulin secretion leading to sugar control. Talked about predisposing factors: family history, diet/weight. Informed pt she will need medication to help control blood sugars, but that we will continue to refine what this regimen will ultimately be; still deciding whether she needs insulin long term, but pt aware she will require insulin for around 3 months post-discharge given how she presented in DKA. Discussed home glucose monitoring with pt. Diabetes coordinator will be by to discuss further with pt later; pt aware.  Pt reports insurance previously denied Ozempic (initially prescribed by PCP for weight loss) as she had not been diagnosed with DM prior to this hospitalization.  Strong FHx of T2DM in brother, cousins, aunts/uncles (?)  Pt has a hx of gestational diabetes  HTN Pt reports she had not been fully compliant with BP medications (hydrochlorothiazide, diltiazem), but that she is resolving to take these daily moving forward. Pt educated that uncontrolled high blood pressure can increase risk for heart attack and stroke.  Physical Exam Pt seated half-up in bed, appears sleepy, no acute distress No ab pain to palpation

## 2022-10-16 NOTE — Discharge Instructions (Addendum)
Follow-up with your cardiologist.  I have changed your diltiazem prescription to 240 mg once daily.

## 2022-10-16 NOTE — ED Provider Notes (Addendum)
[facility] AT [facility] Provider Note   CSN: [id number] Arrival date & time: 10/16/22  1017     History  Chief Complaint  Patient presents with   Tachycardia    [name] is a 47 y.o. female.  Patient here with fast heart rate, fatigue, shortness of breath.  Since like she has a history of tachycardia, hypertension.  She is on diltiazem 180 mg once daily.  She is also on hydrochlorothiazide for high blood pressure.  She denies any black or bloody stools.  Denies any recent surgery or foreign travel.  She states that her heart rates been faster recently more in the 100 range.  Denies any chest pain nausea vomiting diarrhea.  The history is provided by the patient.       Home Medications Prior to Admission medications   Medication Sig Start Date End Date Taking? Authorizing Provider  hydrochlorothiazide (HYDRODIURIL) 25 MG tablet Take 1 tablet (25 mg total) by mouth daily. 01/01/22   Tobb, Lavona Mound, DO      Allergies    Patient has no known allergies.    Review of Systems   Review of Systems  Physical Exam Updated Vital Signs BP (!) 170/106   Pulse (!) 106   Temp 97.8 F (36.6 C)   Resp (!) 29   Ht 5\' 8"  (1.727 m)   Wt 116.6 kg   SpO2 100%   BMI 39.09 kg/m  Physical Exam Vitals and nursing note reviewed.  Constitutional:      General: She is not in acute distress.    Appearance: She is well-developed. She is not ill-appearing.  HENT:     Head: Normocephalic and atraumatic.     Nose: Nose normal.     Mouth/Throat:     Mouth: Mucous membranes are moist.  Eyes:     Extraocular Movements: Extraocular movements intact.     Conjunctiva/sclera: Conjunctivae normal.     Pupils: Pupils are equal, round, and reactive to light.  Cardiovascular:     Rate and Rhythm: Regular rhythm. Tachycardia present.     Pulses: Normal pulses.     Heart sounds: Normal heart sounds. No murmur heard. Pulmonary:     Effort: Pulmonary effort is  normal. No respiratory distress.     Breath sounds: Normal breath sounds.  Abdominal:     Palpations: Abdomen is soft.     Tenderness: There is no abdominal tenderness.  Musculoskeletal:        General: No swelling.     Cervical back: Normal range of motion and neck supple.  Skin:    General: Skin is warm and dry.     Capillary Refill: Capillary refill takes less than 2 seconds.  Neurological:     General: No focal deficit present.     Mental Status: She is alert and oriented to person, place, and time.     Cranial Nerves: No cranial nerve deficit.     Sensory: No sensory deficit.     Motor: No weakness.     Coordination: Coordination normal.     Comments: 5+ out of 5 strength throughout, normal sensation, no drift, normal finger-nose-finger, normal speech  Psychiatric:        Mood and Affect: Mood normal.     ED Results / Procedures / Treatments   Labs (all labs ordered are listed, but only abnormal results are displayed) Labs Reviewed  CBC - Abnormal; Notable for the following components:  Result Value   HCT 46.8 (*)    All other components within normal limits  BASIC METABOLIC PANEL - Abnormal; Notable for the following components:   Sodium 134 (*)    Potassium 3.4 (*)    CO2 8 (*)    Glucose, Bld 380 (*)    Creatinine, Ser 1.22 (*)    GFR, Estimated 56 (*)    Anion gap 24 (*)    All other components within normal limits  D-DIMER, QUANTITATIVE - Abnormal; Notable for the following components:   D-Dimer, Quant 0.53 (*)    All other components within normal limits  HCG, SERUM, QUALITATIVE  D-DIMER, QUANTITATIVE  BETA-HYDROXYBUTYRIC ACID  URINALYSIS, ROUTINE W REFLEX MICROSCOPIC  HEMOGLOBIN A1C  I-STAT VENOUS BLOOD GAS, ED  TROPONIN I (HIGH SENSITIVITY)    EKG EKG Interpretation Date/Time:  Tuesday October 16 2022 10:13:46 EDT Ventricular Rate:  126 PR Interval:  134 QRS Duration:  84 QT Interval:  330 QTC Calculation: 477 R Axis:   22  Text  Interpretation: Sinus tachycardia Otherwise normal ECG When compared with ECG of 03-Mar-2001 16:46, PREVIOUS ECG IS PRESENT Confirmed by Virgina Norfolk [phone]) on 10/16/2022 10:33:20 AM  Radiology DG Chest Portable 1 View  Result Date: 10/16/2022 CLINICAL DATA:  Chest pain EXAM: PORTABLE CHEST 1 VIEW COMPARISON:  03/03/2012 FINDINGS: The cardiomediastinal silhouette is unremarkable. There is no evidence of focal airspace disease, pulmonary edema, suspicious pulmonary nodule/mass, pleural effusion, or pneumothorax. No acute bony abnormalities are identified. IMPRESSION: No active disease. Electronically Signed   By: Harmon Pier M.D.   On: 10/16/2022 11:36    Procedures .Critical Care  Performed by: Virgina Norfolk, DO Authorized by: Virgina Norfolk, DO   Critical care provider statement:    Critical care time (minutes):  45   Critical care was necessary to treat or prevent imminent or life-threatening deterioration of the following conditions: new DKA.   Critical care was time spent personally by me on the following activities:  Blood draw for specimens, development of treatment plan with patient or surrogate, discussions with primary provider, evaluation of patient's response to treatment, examination of patient, obtaining history from patient or surrogate, ordering and performing treatments and interventions, ordering and review of laboratory studies, ordering and review of radiographic studies, pulse oximetry, re-evaluation of patient's condition and review of old charts   Care discussed with: admitting provider       Medications Ordered in ED Medications  sodium chloride 0.9 % bolus 1,000 mL (has no administration in time range)  insulin regular, human (MYXREDLIN) 100 units/ 100 mL infusion (has no administration in time range)  lactated ringers infusion (has no administration in time range)  dextrose 5 % in lactated ringers infusion (has no administration in time range)  dextrose 50 %  solution 0-50 mL (has no administration in time range)  potassium chloride 10 mEq in 100 mL IVPB (has no administration in time range)  sodium chloride 0.9 % bolus 1,000 mL (0 mLs Intravenous Stopped 10/16/22 1237)  iohexol (OMNIPAQUE) 350 MG/ML injection 75 mL (75 mLs Intravenous Contrast Given 10/16/22 1447)    ED Course/ Medical Decision Making/ A&P                             Medical Decision Making Amount and/or Complexity of Data Reviewed Labs: ordered. Radiology: ordered.  Risk Prescription drug management. Decision regarding hospitalization.   Anyah Correia is here with tachycardia, fast heart rate,  shortness of breath.  She is little bit tachycardic in the 110 range.  Sinus tachycardia on EKG per my review and interpretation.  She is on diltiazem for the same.  She is short of breath.  She is a little bit hypertensive.  Overall will evaluate for PE, ACS, electrolyte abnormality, anemia, infectious process.  This could be her natural sinus tachycardia not been as well-controlled and her meds versus some anxiety.  Overall we will give IV fluids.  If workup is unremarkable we will likely increase her diltiazem and have her follow-up with cardiology.  Per my review and interpretation of labs after labs were hemolyzed and there was delay in getting her metabolic panel patient does appear to be in DKA.  She has a bicarb of 8, blood sugar of 380, anion gap of 24.  Creatinine mildly elevated at 1.2.  Potassium is 3.4.  D-dimer was mildly elevated and we will get a CT scan to further rule out blood clot but overall I think her symptoms are secondary to knee DKA.  Will give an additional liter fluid bolus and start her on insulin per Endo tool protocol.  Will admit to medicine for further care.  Internal medicine team wanted me to reach out to ICU team who will review case.  Overall I do think that she is stable for aggressive bed.  This chart was dictated using voice recognition software.   Despite best efforts to proofread,  errors can occur which can change the documentation meaning.     Final Clinical Impression(s) / ED Diagnoses Final diagnoses:  Diabetic ketoacidosis without coma associated with other specified diabetes mellitus (HCC)      Virgina Norfolk, DO 10/16/22 1431    Robertson Colclough, DO 10/16/22 1454

## 2022-10-16 NOTE — ED Notes (Signed)
ED TO INPATIENT HANDOFF REPORT  ED Nurse Name and Phone #: [phone] Eula Fried Name/Age/Gender [name] Paul 47 y.o. female Room/Bed: 003C/003C  Code Status   Code Status: Full Code  Home/SNF/Other Home Patient oriented to: self, place, time, and situation Is this baseline? Yes   Triage Complete: Triage complete  Chief Complaint Diabetic ketoacidosis (HCC) [E11.10] DKA (diabetic ketoacidosis) (HCC) [E11.10]  Triage Note Pt c/o tachycardiax3d. Pt states she was out of town and didn't want to go to ED out of town. Pt c/o SOB. Pt able to speak in complete sentences.   Allergies No Known Allergies  Level of Care/Admitting Diagnosis ED Disposition     ED Disposition  Admit   Condition  --   Comment  Hospital [facility] [[id number]]  Level of Care: Progressive [102]  Admit to Progressive based on following criteria: GI, ENDOCRINE disease patients with GI bleeding, acute liver failure or pancreatitis, stable with diabetic ketoacidosis or thyrotoxicosis (hypothyroid) state.  May admit patient to [facility] or [facility] if equivalent level of care is available:: No  Covid Evaluation: Asymptomatic - no recent exposure (last 10 days) testing not required  Diagnosis: DKA (diabetic ketoacidosis) [facility]) [[id number]]  Admitting Physician: Inez Catalina [phone]  Attending Physician: Inez Catalina [phone]  Certification:: I certify this patient will need inpatient services for at least 2 midnights  Estimated Length of Stay: 2          B Medical/Surgery History 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   Gestational diabetes    Hypertension    no meds now   Mild or unspecified pre-eclampsia, with delivery 06/07/2013   Morbid obesity [facility])    Postpartum care following cesarean delivery - twins (2/20) 06/07/2013   Past Surgical History:  Procedure Laterality Date   CESAREAN SECTION N/A  06/05/2013   Procedure: Primary CESAREAN SECTION; Twins;  Surgeon: Lenoard Aden, MD;  Location: [facility];  Service: Obstetrics;  Laterality: N/A;  EDD: 06/30/13   PARTIAL MASTECTOMY WITH NEEDLE LOCALIZATION  03/05/2012   Procedure: PARTIAL MASTECTOMY WITH NEEDLE LOCALIZATION;  Surgeon: Maisie Fus A. Cornett, MD;  Location: [facility];  Service: General;  Laterality: Right;   reconstructive surgery  1977-02-05 &1992     A IV Location/Drains/Wounds Patient Lines/Drains/Airways Status     Active Line/Drains/Airways     Name Placement date Placement time Site Days   Peripheral IV 10/16/22 20 G Posterior;Left Wrist 10/16/22  1121  Wrist  less than 1   Peripheral IV 10/16/22 22 G Right Antecubital 10/16/22  1442  Antecubital  less than 1   Incision 03/05/12 Breast Right 03/05/12  1257  -- 3877   Incision (Closed) 06/05/13 Abdomen Other (Comment) 06/05/13  1406  -- 3420   Incision (Closed) 06/05/13 Vagina Other (Comment) 06/05/13  1406  -- 3420            Intake/Output Last 24 hours No intake or output data in the 24 hours ending 10/16/22 1806  Labs/Imaging Results for orders placed or performed during the hospital encounter of 10/16/22 (from the past 48 hour(s))  Troponin I (High Sensitivity)     Status: None   Collection Time: 10/16/22 10:42 AM  Result Value Ref Range   Troponin I (High Sensitivity) 7 <18 ng/L    Comment: (NOTE) Elevated high sensitivity troponin I (hsTnI) values and significant  changes across serial measurements may suggest  ACS but many other  chronic and acute conditions are known to elevate hsTnI results.  Refer to the "Links" section for chest pain algorithms and additional  guidance. Performed at [facility], [address], [place], [place] [address]   D-dimer, quantitative     Status: None   Collection Time: 10/16/22 10:42 AM  Result Value Ref Range   D-Dimer, Quant  0.00 - 0.50 ug/mL-FEU    SPECIMEN HEMOLYZED. HEMOLYSIS MAY AFFECT  INTEGRITY OF RESULTS.    Comment: NOTIFIED Clytee Heinrich,M RN @ 1207 10/16/22 BY GANADEN,G (NOTE) At the manufacturer cut-off value of 0.5 g/mL FEU, this assay has a negative predictive value of 95-100%.This assay is intended for use in conjunction with a clinical pretest probability (PTP) assessment model to exclude pulmonary embolism (PE) and deep venous thrombosis (DVT) in outpatients suspected of PE or DVT. Results should be correlated with clinical presentation. Performed at [facility], [address], [place], [place] [address]   CBC     Status: Abnormal   Collection Time: 10/16/22 11:20 AM  Result Value Ref Range   WBC 7.0 4.0 - 10.5 K/uL   RBC 5.04 3.87 - 5.11 MIL/uL   Hemoglobin 14.9 12.0 - 15.0 g/dL   HCT 09.8 (H) 11.9 - 14.7 %   MCV 92.9 80.0 - 100.0 fL   MCH 29.6 26.0 - 34.0 pg   MCHC 31.8 30.0 - 36.0 g/dL   RDW 82.9 56.2 - 13.0 %   Platelets 191 150 - 400 K/uL    Comment: REPEATED TO VERIFY   nRBC 0.0 0.0 - 0.2 %    Comment: Performed at [facility], [address], [place], [place] [address]  hCG, serum, qualitative     Status: None   Collection Time: 10/16/22 11:20 AM  Result Value Ref Range   Preg, Serum NEGATIVE NEGATIVE    Comment:        THE SENSITIVITY OF THIS METHODOLOGY IS >10 mIU/mL. Performed at [facility], [address], [place], [place] [address]   Basic metabolic panel     Status: Abnormal   Collection Time: 10/16/22 12:31 PM  Result Value Ref Range   Sodium 134 (L) 135 - 145 mmol/L   Potassium 3.4 (L) 3.5 - 5.1 mmol/L   Chloride 102 98 - 111 mmol/L   CO2 8 (L) 22 - 32 mmol/L   Glucose, Bld 380 (H) 70 - 99 mg/dL    Comment: Glucose reference range applies only to samples taken after fasting for at least 8 hours.   BUN 10 6 - 20 mg/dL   Creatinine, Ser 9.52 (H) 0.44 - 1.00 mg/dL   Calcium 8.9 8.9 - 84.1 mg/dL   GFR, Estimated 56 (L) >60 mL/min    Comment: (NOTE) Calculated using the CKD-EPI Creatinine Equation (2021)     Anion gap 24 (H) 5 - 15    Comment: ELECTROLYTES REPEATED TO VERIFY Performed at [facility], [address] [place], [place] [address]   D-dimer, quantitative     Status: Abnormal   Collection Time: 10/16/22 12:31 PM  Result Value Ref Range   D-Dimer, Quant 0.53 (H) 0.00 - 0.50 ug/mL-FEU    Comment: (NOTE) At the manufacturer cut-off value of 0.5 g/mL FEU, this assay has a negative predictive value of 95-100%.This assay is intended for use in conjunction with a clinical pretest probability (PTP) assessment model to exclude pulmonary embolism (PE) and deep venous thrombosis (DVT) in  outpatients suspected of PE or DVT. Results should be correlated with clinical presentation. Performed at [facility], [address], [place], [place] [address]   Beta-hydroxybutyric acid     Status: Abnormal   Collection Time: 10/16/22  3:00 PM  Result Value Ref Range   Beta-Hydroxybutyric Acid >8.00 (H) 0.05 - 0.27 mmol/L    Comment: RESULT CONFIRMED BY MANUAL DILUTION Performed at [facility], [address], [place], [place] [address]   Hemoglobin A1c     Status: Abnormal   Collection Time: 10/16/22  3:00 PM  Result Value Ref Range   Hgb A1c MFr Bld 12.1 (H) 4.8 - 5.6 %    Comment: (NOTE) Pre diabetes:          5.7%-6.4%  Diabetes:              >6.4%  Glycemic control for   <7.0% adults with diabetes    Mean Plasma Glucose 300.57 mg/dL    Comment: Performed at [facility], [address] [place], [place] [address]  I-Stat venous blood gas, [facility] ED, [facility], [facility])     Status: Abnormal   Collection Time: 10/16/22  3:21 PM  Result Value Ref Range   pH, Ven 7.211 (L) 7.25 - 7.43   pCO2, Ven 18.8 (LL) 44 - 60 mmHg   pO2, Ven 125 (H) 32 - 45 mmHg   Bicarbonate 7.5 (L) 20.0 - 28.0 mmol/L   TCO2 8 (L) 22 - 32 mmol/L   O2 Saturation 98 %   Acid-base deficit 18.0 (H) 0.0 - 2.0 mmol/L   Sodium 136 135 - 145 mmol/L   Potassium 3.3 (L) 3.5 - 5.1 mmol/L   Calcium, Ion  1.12 (L) 1.15 - 1.40 mmol/L   HCT 44.0 36.0 - 46.0 %   Hemoglobin 15.0 12.0 - 15.0 g/dL   Sample type VENOUS    Comment NOTIFIED PHYSICIAN   CBG monitoring, ED     Status: Abnormal   Collection Time: 10/16/22  5:26 PM  Result Value Ref Range   Glucose-Capillary 299 (H) 70 - 99 mg/dL    Comment: Glucose reference range applies only to samples taken after fasting for at least 8 hours.   CT Angio Chest PE W and/or Wo Contrast  Result Date: 10/16/2022 CLINICAL DATA:  Pulmonary embolism (PE) suspected, low to intermediate prob, positive D-dimer EXAM: CT ANGIOGRAPHY CHEST WITH CONTRAST TECHNIQUE: Multidetector CT imaging of the chest was performed using the standard protocol during bolus administration of intravenous contrast. Multiplanar CT image reconstructions and MIPs were obtained to evaluate the vascular anatomy. RADIATION DOSE REDUCTION: This exam was performed according to the departmental dose-optimization program which includes automated exposure control, adjustment of the mA and/or kV according to patient size and/or use of iterative reconstruction technique. CONTRAST:  75mL OMNIPAQUE IOHEXOL 350 MG/ML SOLN COMPARISON:  None Available. FINDINGS: Cardiovascular: Satisfactory opacification of the pulmonary arteries to the segmental level. No evidence of pulmonary embolism. Normal heart size. No pericardial effusion. Mediastinum/Nodes: No enlarged mediastinal, hilar, or axillary lymph nodes. Thyroid gland, trachea, and esophagus demonstrate no significant findings. Lungs/Pleura: Lungs are clear. No pleural effusion or pneumothorax. Upper Abdomen: No acute abnormality. Musculoskeletal: No chest wall abnormality. No acute or significant osseous findings. Review of the MIP images confirms the above findings. IMPRESSION: 1. No evidence of pulmonary embolism. 2. No acute intrathoracic process. Electronically Signed   By: Lorenza Cambridge M.D.   On: 10/16/2022 15:31   DG Chest Portable 1  View  Result Date:  10/16/2022 CLINICAL DATA:  Chest pain EXAM: PORTABLE CHEST 1 VIEW COMPARISON:  03/03/2012 FINDINGS: The cardiomediastinal silhouette is unremarkable. There is no evidence of focal airspace disease, pulmonary edema, suspicious pulmonary nodule/mass, pleural effusion, or pneumothorax. No acute bony abnormalities are identified. IMPRESSION: No active disease. Electronically Signed   By: Harmon Pier M.D.   On: 10/16/2022 11:36    Pending Labs Unresulted Labs (From admission, onward)     Start     Ordered   10/17/22 0500  CBC  Tomorrow morning,   R        10/16/22 1608   10/16/22 1611  Osmolality  (Diabetes Ketoacidosis (DKA))  ONCE - STAT,   STAT        10/16/22 1611   10/16/22 1611  Urinalysis, Routine w reflex microscopic -Urine, Clean Catch  (Diabetes Ketoacidosis (DKA))  ONCE - STAT,   STAT       Question:  Specimen Source  Answer:  Urine, Clean Catch   10/16/22 1611   10/16/22 1610  Basic metabolic panel  (Diabetes Ketoacidosis (DKA))  STAT Now then every 4 hours ,   STAT      10/16/22 1611   10/16/22 1606  HIV Antibody (routine testing w rflx)  (HIV Antibody (Routine testing w reflex) panel)  Once,   URGENT        10/16/22 1608            Vitals/Pain Today's Vitals   10/16/22 1025 10/16/22 1029 10/16/22 1200  BP: (!) 182/117  (!) 170/106  Pulse: (!) 129  (!) 106  Resp: 18  (!) 29  Temp: 97.8 F (36.6 C)    SpO2: 99%  100%  Weight:  116.6 kg   Height:  5\' 8"  (1.727 m)   PainSc:  0-No pain     Isolation Precautions No active isolations  Medications Medications  enoxaparin (LOVENOX) injection 50 mg (has no administration in time range)  lactated ringers bolus 2,332 mL (has no administration in time range)  insulin regular, human (MYXREDLIN) 100 units/ 100 mL infusion (15 Units/hr Intravenous New Bag/Given 10/16/22 1732)  lactated ringers infusion (has no administration in time range)  dextrose 5 % in lactated ringers infusion (has no administration in time range)  dextrose  50 % solution 0-50 mL (has no administration in time range)  potassium chloride 10 mEq in 100 mL IVPB (has no administration in time range)  diltiazem (CARDIZEM CD) 24 hr capsule 180 mg (has no administration in time range)  sodium chloride 0.9 % bolus 1,000 mL (0 mLs Intravenous Stopped 10/16/22 1237)  iohexol (OMNIPAQUE) 350 MG/ML injection 75 mL (75 mLs Intravenous Contrast Given 10/16/22 1447)    Mobility walks     Focused Assessments Cardiac Assessment Handoff:    No results found for: "CKTOTAL", "CKMB", "CKMBINDEX", "TROPONINI" Lab Results  Component Value Date   DDIMER 0.53 (H) 10/16/2022   Does the Patient currently have chest pain? No    R Recommendations: See Admitting Provider Note  Report given to:   Additional Notes: Patient alert and oriented, reports fast heart rate x 3 days cause her to feel tired.

## 2022-10-16 NOTE — ED Triage Notes (Signed)
Pt c/o tachycardiax3d. Pt states she was out of town and didn't want to go to ED out of town. Pt c/o SOB. Pt able to speak in complete sentences.

## 2022-10-16 NOTE — H&P (Cosign Needed Addendum)
Date: 10/16/2022               Patient Name:  [name] MRN: [id number]  DOB: [birth year] Age / Sex: 47 y.o., female   PCP: Patient, No Pcp Per         Medical Service: Internal Medicine Teaching Service         Attending Physician: : Dr. Criselda Peaches    First Contact:  Faith Rogue, DO     Pager: [phone]      Second Contact: Morene Crocker, MD      Pager: [facility] [phone]           After Hours (After 5p/  First Contact Pager: [phone]  weekends / holidays): Second Contact Pager: [phone]   SUBJECTIVE   Chief Complaint: Tachycardia  History of Present Illness: [name] is a 47 year old female with a past medical history of uncontrolled HTN, HLD, prediabetes, who is presenting for tachycardia and overall malaise   Per patient, her initial symptoms were rapid heart rate to 120 and fluttering feeling in her chest. She initially attributed this to not taking her medication as she had forgotten it prior to leaving for [facility] ~1 week ago. During that trip, she also felt GI discomfort, abdominal pain, and nausea. She has also been feeling "foggy". [name] reports that her symptoms have been worsening since Wednesday of last week after she returned home from a trip to [place]. She currently denies CP but does endorses tachypnea. Lately, she admits to feeling dizzy, with excessive thirst and urination, despite drinking g'allons of water' for the past 3 days. During this time, she also reports 3/10 right lower sided abdominal pain that is not exacerbated by eating.  Patient denies fevers, chills, sputum production, viral illnesses, diarrhea, or changes in her recent medications. She does not have recent history of surgery.   ED Course: Patient was found to be tachycardic and hypertensive in the ED. Her laboratory work up was notable for a hyperglycemia, hypokalemia, and an anion gap metabolic acidosis. She was also evaluated with a ddimer that was positive, followed by a CT PE  protocol that was negative. Patient was given 1L bolus of IVF and was admitted to the IMTS for further evaluation at treatment   Meds:  Current Meds  Medication Sig   diltiazem (CARDIZEM LA) 180 MG 24 hr tablet Take 180 mg by mouth daily.   hydrochlorothiazide (HYDRODIURIL) 25 MG tablet Take 1 tablet (25 mg total) by mouth daily.    Past Medical History  Past Surgical History:  Procedure Laterality Date   CESAREAN SECTION N/A 06/05/2013   Procedure: Primary CESAREAN SECTION; Twins;  Surgeon: Lenoard Aden, MD;  Location: [facility];  Service: Obstetrics;  Laterality: N/A;  EDD: 06/30/13   PARTIAL MASTECTOMY WITH NEEDLE LOCALIZATION  03/05/2012   Procedure: PARTIAL MASTECTOMY WITH NEEDLE LOCALIZATION;  Surgeon: Maisie Fus A. Cornett, MD;  Location: [facility];  Service: General;  Laterality: Right;   reconstructive surgery  03-26-77 &1992  Menstrual period still spotting June 10 - IUD placed ~7 months ago  Social:  Lives With: Lives by herself with her twin children Occupation: works from home at [employer] Support: sister and brother Level of Function: Independent  PCP: none Substances:Quit marijuana in January, no tobacco, quit drinking 1 pot of coffee per day in January   Family History: Maternal - DM, HTN, heart disease, CAD, blood clots, stroke  Allergies: Allergies as of 10/16/2022   (No Known Allergies)  Review of Systems: A complete ROS was negative except as per HPI.   OBJECTIVE:   Physical Exam: Blood pressure (!) 170/106, pulse (!) 106, temperature 97.8 F (36.6 C), resp. rate (!) 29, height 5\' 8"  (1.727 m), weight 116.6 kg, SpO2 100 %.  Constitutional: well-appearing, pleasant mood, sitting in bed, in no acute distress Cardiovascular: tachycardic, 2+ pulses present in extremities  Pulmonary/Chest: normal work of breathing on room air, lungs clear to auscultation bilaterally. NO crackles  Abdominal: soft, non-tender, non-distended Neurological: alert &  oriented x 3, moving all extremities MSK: no gross abnormalities. No pitting edema Skin: warm and dry Psych: Normal mood and affect  Labs: CBC    Component Value Date/Time   WBC 7.0 10/16/2022 1120   RBC 5.04 10/16/2022 1120   HGB 15.0 10/16/2022 1521   HGB 12.3 02/01/2017 1714   HCT 44.0 10/16/2022 1521   HCT 37.8 02/01/2017 1714   PLT 191 10/16/2022 1120   PLT 255 02/01/2017 1714   MCV 92.9 10/16/2022 1120   MCV 89 02/01/2017 1714   MCH 29.6 10/16/2022 1120   MCHC 31.8 10/16/2022 1120   RDW 12.5 10/16/2022 1120   RDW 13.2 02/01/2017 1714   LYMPHSABS 2.7 11/29/2021 1158   LYMPHSABS 2.7 02/01/2017 1714   MONOABS 0.5 11/29/2021 1158   EOSABS 0.0 11/29/2021 1158   EOSABS 0.0 02/01/2017 1714   BASOSABS 0.0 11/29/2021 1158   BASOSABS 0.0 02/01/2017 1714     CMP     Component Value Date/Time   NA 135 10/16/2022 1729   NA 139 02/01/2017 1714   K 3.5 10/16/2022 1729   CL 108 10/16/2022 1729   CO2 9 (L) 10/16/2022 1729   GLUCOSE 305 (H) 10/16/2022 1729   BUN 8 10/16/2022 1729   BUN 8 02/01/2017 1714   CREATININE 1.04 (H) 10/16/2022 1729   CALCIUM 8.5 (L) 10/16/2022 1729   PROT 6.9 12/02/2021 0759   PROT 7.4 02/01/2017 1714   ALBUMIN 3.6 12/02/2021 0759   ALBUMIN 4.4 02/01/2017 1714   AST 27 12/02/2021 0759   ALT 29 12/02/2021 0759   ALKPHOS 46 12/02/2021 0759   BILITOT 0.4 12/02/2021 0759   BILITOT <0.2 02/01/2017 1714   GFRNONAA >60 10/16/2022 1729   GFRAA 98 02/01/2017 1714    Imaging: DG Chest Portable 1 View  Result Date: 10/16/2022 CLINICAL DATA:  Chest pain EXAM: PORTABLE CHEST 1 VIEW COMPARISON:  03/03/2012 FINDINGS: The cardiomediastinal silhouette is unremarkable. There is no evidence of focal airspace disease, pulmonary edema, suspicious pulmonary nodule/mass, pleural effusion, or pneumothorax. No acute bony abnormalities are identified. IMPRESSION: No active disease. Electronically Signed   By: Harmon Pier M.D.   On: 10/16/2022 11:36      EKG:  personally reviewed my interpretation is sinus tachycardia with QT prolongation without overt signs of ischemia when compared to prior EKG 2023   ASSESSMENT & PLAN:   Assessment & Plan by Problem: Principal Problem:   Diabetic ketoacidosis (HCC) Active Problems:   DKA (diabetic ketoacidosis) (HCC)  Makailah Soots is a 47 year old female with a past medical history of uncontrolled HTN, HLD, prediabetes admitted for tachycardia and diabetic ketoacidosis with metabolic anion gap acidosis.   DKA/Hyperosmolar hyperglycemia Anion Gap Metabolic Acidosis  Patient with known prediabetes evaluated in the ED found to be hyperglycemic at 380 with bicarb of 8, and AG 24.  Further work up revealed BHB > 8, osmolality >300, and A1c of 12.1, confirming diabetes and DKA/HHS diagnosis. Patient was mentating at  baseline and admitted to our service. With a K of 3.3, was started on K supplementation and EndoTool. -NPO and sips with meds -[name] placed on EndoTool -Admitted to progressive floor for monitoring -Will need diabetes education prior to discharge  Sinus tachycardia Patient was initially evaluated in the emergency department for tachycardia. Suspect this is in the setting of DKA/ hypovolemia now that pulmonary embolism and ACS causes have been ruled out. Will continue on cardiac telemetry to assess for arrhythmias at this time. No pain or fever identified at this time. 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 However, giving the timing of tachycardia with DKA symptoms increases suspicion this is in setting of acidotic state - Resume home diltiazem therapy  180 mg nightly -Telemetry  Uncontrolled Hypertension Last seen in Dr. Mallory Shirk office earlier in the year with uncontrolled HTN in the setting of medication non adherence. SBP up to 180 and DBP up to 100, asymptomatic -Home regimen:  Diltiazem and HCTZ -Will resume home  Diltiazem -PRN   AKI Patient does not have documented history of chronic renal disease. Elevated creatine today at 1.22, possibley due to dehydration in setting of DKA with polyuria. Creatine has improved to 1.04 with IVF. -Monitor on labs tomorrow  Obesity Patient has documented history of obesity with BMI of 35.78. Will speak with the patient about dietician follow up outpatient    Diet: NPO VTE: Enoxaparin Code: Full   Dispo: Admit patient to Inpatient with expected length of stay greater than 2 midnights.  Signed:   Faith Rogue, DO  Internal Medicine Resident PGY-1 10/16/2022, 7:00 PM   Please contact the on call pager after 5 pm and on weekends at [phone].

## 2022-10-17 ENCOUNTER — Telehealth (HOSPITAL_COMMUNITY): Payer: Self-pay | Admitting: Pharmacy Technician

## 2022-10-17 ENCOUNTER — Other Ambulatory Visit (HOSPITAL_COMMUNITY): Payer: Self-pay

## 2022-10-17 DIAGNOSIS — E1165 Type 2 diabetes mellitus with hyperglycemia: Secondary | ICD-10-CM

## 2022-10-17 DIAGNOSIS — E669 Obesity, unspecified: Secondary | ICD-10-CM

## 2022-10-17 DIAGNOSIS — E1110 Type 2 diabetes mellitus with ketoacidosis without coma: Principal | ICD-10-CM

## 2022-10-17 DIAGNOSIS — Z794 Long term (current) use of insulin: Secondary | ICD-10-CM

## 2022-10-17 DIAGNOSIS — Z87891 Personal history of nicotine dependence: Secondary | ICD-10-CM

## 2022-10-17 DIAGNOSIS — I495 Sick sinus syndrome: Secondary | ICD-10-CM

## 2022-10-17 DIAGNOSIS — I10 Essential (primary) hypertension: Secondary | ICD-10-CM

## 2022-10-17 DIAGNOSIS — Z7984 Long term (current) use of oral hypoglycemic drugs: Secondary | ICD-10-CM

## 2022-10-17 LAB — URINALYSIS, ROUTINE W REFLEX MICROSCOPIC
Bacteria, UA: NONE SEEN
Bilirubin Urine: NEGATIVE
Glucose, UA: >=500 mg/dL — AB
Ketones, ur: 80 mg/dL — AB
Leukocytes,Ua: NEGATIVE
Nitrite: NEGATIVE
Protein, ur: NEGATIVE mg/dL
Specific Gravity, Urine: 1.022 (ref 1.005–1.030)
pH: 5 (ref 5.0–8.0)

## 2022-10-17 LAB — GLUCOSE, CAPILLARY
Glucose-Capillary: 164 mg/dL — ABNORMAL HIGH (ref 70–99)
Glucose-Capillary: 171 mg/dL — ABNORMAL HIGH (ref 70–99)
Glucose-Capillary: 178 mg/dL — ABNORMAL HIGH (ref 70–99)
Glucose-Capillary: 196 mg/dL — ABNORMAL HIGH (ref 70–99)
Glucose-Capillary: 243 mg/dL — ABNORMAL HIGH (ref 70–99)
Glucose-Capillary: 253 mg/dL — ABNORMAL HIGH (ref 70–99)
Glucose-Capillary: 296 mg/dL — ABNORMAL HIGH (ref 70–99)
Glucose-Capillary: 320 mg/dL — ABNORMAL HIGH (ref 70–99)
Glucose-Capillary: 350 mg/dL — ABNORMAL HIGH (ref 70–99)
Glucose-Capillary: 357 mg/dL — ABNORMAL HIGH (ref 70–99)
Glucose-Capillary: 361 mg/dL — ABNORMAL HIGH (ref 70–99)

## 2022-10-17 LAB — MAGNESIUM: Magnesium: 1.6 mg/dL — ABNORMAL LOW (ref 1.7–2.4)

## 2022-10-17 LAB — RENAL FUNCTION PANEL
Albumin: 2.9 g/dL — ABNORMAL LOW (ref 3.5–5.0)
Albumin: 3 g/dL — ABNORMAL LOW (ref 3.5–5.0)
Anion gap: 8 (ref 5–15)
Anion gap: 13 (ref 5–15)
BUN: <5 mg/dL — ABNORMAL LOW (ref 6–20)
BUN: 6 mg/dL (ref 6–20)
CO2: 20 mmol/L — ABNORMAL LOW (ref 22–32)
CO2: 21 mmol/L — ABNORMAL LOW (ref 22–32)
Calcium: 8 mg/dL — ABNORMAL LOW (ref 8.9–10.3)
Calcium: 8.5 mg/dL — ABNORMAL LOW (ref 8.9–10.3)
Chloride: 102 mmol/L (ref 98–111)
Chloride: 100 mmol/L (ref 98–111)
Creatinine, Ser: 0.72 mg/dL (ref 0.44–1.00)
Creatinine, Ser: 0.71 mg/dL (ref 0.44–1.00)
GFR, Estimated: >60 mL/min (ref >60)
GFR, Estimated: >60 mL/min (ref >60)
Glucose, Bld: 252 mg/dL — ABNORMAL HIGH (ref 70–99)
Glucose, Bld: 258 mg/dL — ABNORMAL HIGH (ref 70–99)
Phosphorus: <1 mg/dL — CL (ref 2.5–4.6)
Phosphorus: 2 mg/dL — ABNORMAL LOW (ref 2.5–4.6)
Potassium: 3.2 mmol/L — ABNORMAL LOW (ref 3.5–5.1)
Potassium: 3.3 mmol/L — ABNORMAL LOW (ref 3.5–5.1)
Sodium: 130 mmol/L — ABNORMAL LOW (ref 135–145)
Sodium: 134 mmol/L — ABNORMAL LOW (ref 135–145)

## 2022-10-17 LAB — CBC
HCT: 34.7 % — ABNORMAL LOW (ref 36.0–46.0)
Hemoglobin: 11.8 g/dL — ABNORMAL LOW (ref 12.0–15.0)
MCH: 29.9 pg (ref 26.0–34.0)
MCHC: 34 g/dL (ref 30.0–36.0)
MCV: 88.1 fL (ref 80.0–100.0)
Platelets: 149 10*3/uL — ABNORMAL LOW (ref 150–400)
RBC: 3.94 MIL/uL (ref 3.87–5.11)
RDW: 12.3 % (ref 11.5–15.5)
WBC: 5.1 10*3/uL (ref 4.0–10.5)
nRBC: 0 % (ref 0.0–0.2)

## 2022-10-17 LAB — BASIC METABOLIC PANEL
Anion gap: 13 (ref 5–15)
Anion gap: 8 (ref 5–15)
Anion gap: 11 (ref 5–15)
BUN: 5 mg/dL — ABNORMAL LOW (ref 6–20)
BUN: <5 mg/dL — ABNORMAL LOW (ref 6–20)
BUN: <5 mg/dL — ABNORMAL LOW (ref 6–20)
CO2: 13 mmol/L — ABNORMAL LOW (ref 22–32)
CO2: 19 mmol/L — ABNORMAL LOW (ref 22–32)
CO2: 21 mmol/L — ABNORMAL LOW (ref 22–32)
Calcium: 7.8 mg/dL — ABNORMAL LOW (ref 8.9–10.3)
Calcium: 8.3 mg/dL — ABNORMAL LOW (ref 8.9–10.3)
Calcium: 8.3 mg/dL — ABNORMAL LOW (ref 8.9–10.3)
Chloride: 108 mmol/L (ref 98–111)
Chloride: 110 mmol/L (ref 98–111)
Chloride: 106 mmol/L (ref 98–111)
Creatinine, Ser: 0.95 mg/dL (ref 0.44–1.00)
Creatinine, Ser: 0.72 mg/dL (ref 0.44–1.00)
Creatinine, Ser: 0.68 mg/dL (ref 0.44–1.00)
GFR, Estimated: >60 mL/min (ref >60)
GFR, Estimated: >60 mL/min (ref >60)
GFR, Estimated: >60 mL/min (ref >60)
Glucose, Bld: 403 mg/dL — ABNORMAL HIGH (ref 70–99)
Glucose, Bld: 169 mg/dL — ABNORMAL HIGH (ref 70–99)
Glucose, Bld: 176 mg/dL — ABNORMAL HIGH (ref 70–99)
Potassium: 2.8 mmol/L — ABNORMAL LOW (ref 3.5–5.1)
Potassium: 2.6 mmol/L — CL (ref 3.5–5.1)
Potassium: 3 mmol/L — ABNORMAL LOW (ref 3.5–5.1)
Sodium: 134 mmol/L — ABNORMAL LOW (ref 135–145)
Sodium: 137 mmol/L (ref 135–145)
Sodium: 138 mmol/L (ref 135–145)

## 2022-10-17 MED ORDER — INSULIN ASPART 100 UNIT/ML IJ SOLN
0.0000 [IU] | Freq: Three times a day (TID) | INTRAMUSCULAR | Status: DC
  Administered 2022-10-17: 11 [IU] via SUBCUTANEOUS
  Administered 2022-10-17 – 2022-10-18 (×2): 5 [IU] via SUBCUTANEOUS
  Administered 2022-10-18: 8 [IU] via SUBCUTANEOUS

## 2022-10-17 MED ORDER — POTASSIUM CHLORIDE CRYS ER 20 MEQ PO TBCR
40.0000 meq | EXTENDED_RELEASE_TABLET | Freq: Once | ORAL | Status: AC
  Administered 2022-10-18: 40 meq via ORAL
  Filled 2022-10-17: qty 2

## 2022-10-17 MED ORDER — INSULIN ASPART 100 UNIT/ML IJ SOLN
0.0000 [IU] | Freq: Every day | INTRAMUSCULAR | Status: DC
  Administered 2022-10-17: 3 [IU] via SUBCUTANEOUS

## 2022-10-17 MED ORDER — LIVING WELL WITH DIABETES BOOK
Freq: Once | Status: AC
  Filled 2022-10-17: qty 1

## 2022-10-17 MED ORDER — SODIUM CHLORIDE 0.9 % IV SOLN
45.0000 mmol | Freq: Once | INTRAVENOUS | Status: AC
  Administered 2022-10-17: 45 mmol via INTRAVENOUS
  Filled 2022-10-17: qty 15

## 2022-10-17 MED ORDER — MAGNESIUM SULFATE 4 GM/100ML IV SOLN
4.0000 g | Freq: Once | INTRAVENOUS | Status: AC
  Administered 2022-10-17: 4 g via INTRAVENOUS
  Filled 2022-10-17: qty 100

## 2022-10-17 MED ORDER — POTASSIUM CHLORIDE 10 MEQ/100ML IV SOLN
10.0000 meq | INTRAVENOUS | Status: AC
  Administered 2022-10-17 (×6): 10 meq via INTRAVENOUS
  Filled 2022-10-17 (×5): qty 100

## 2022-10-17 MED ORDER — INSULIN GLARGINE-YFGN 100 UNIT/ML ~~LOC~~ SOLN
27.0000 [IU] | Freq: Every day | SUBCUTANEOUS | Status: DC
  Administered 2022-10-17: 27 [IU] via SUBCUTANEOUS
  Filled 2022-10-17 (×2): qty 0.27

## 2022-10-17 MED ORDER — POTASSIUM CHLORIDE CRYS ER 20 MEQ PO TBCR
40.0000 meq | EXTENDED_RELEASE_TABLET | Freq: Once | ORAL | Status: AC
  Administered 2022-10-17: 40 meq via ORAL
  Filled 2022-10-17: qty 2

## 2022-10-17 MED ORDER — INSULIN STARTER KIT- PEN NEEDLES (ENGLISH)
1.0000 | Freq: Once | Status: AC
  Administered 2022-10-17: 1
  Filled 2022-10-17: qty 1

## 2022-10-17 NOTE — Inpatient Diabetes Management (Signed)
Inpatient Diabetes Program Recommendations  AACE/ADA: New Consensus Statement on Inpatient Glycemic Control (2015)  Target Ranges:  Prepandial:   less than 140 mg/dL      Peak postprandial:   less than 180 mg/dL (1-2 hours)      Critically ill patients:  140 - 180 mg/dL   Lab Results  Component Value Date   GLUCAP 243 (H) 10/17/2022   HGBA1C 12.1 (H) 10/16/2022  Spoke with patient @ bedside. Reviewed basic nutrition questions along with exercise to equal 150 minutes per week when patient is able to and MD gives permission. Patient shared that she was diagnosed with gestational diabetes during her pregnancy 9 years ago but was not on insulin for management of blood glucose. Was diagnosed as prediabetes. Patient states her cardiologist had ordered [facility] for her to start but her insurance would not cover since she did not have a diagnosis of diabetes  Patient recently started drinking one gallon of water daily and has lost 15 lbs over the past 2 weeks.  Educated patient on insulin pen use at home. Reviewed contents of insulin flexpen starter kit. Reviewed all steps if insulin pen including attachment of needle, 2-unit air shot, dialing up dose, giving injection, removing needle, disposal of sharps, storage of unused insulin, disposal of insulin etc. Patient able to provide successful return demonstration. Also reviewed troubleshooting with insulin pen. MD to give patient Rxs for insulin pens and insulin pen needles.   MD ordered application of Freestyle CGM at discharge for patient. Education done regarding application and changing CGM sensor (alternate every 14 days on back of arms), 1 hour warm-up, use of glucometer when alert displays, how to scan CGM for glucose reading and information for PCP. Patient has also been given educational packet regarding use CGM sensor including the 1-800 toll free number for any questions, problems or needs related to the [employer] sensors or reader.     Sensor applied by patient to (L) Arm at 100p.  Explained that glucose readings will not be available until 1 hour after application. Reviewed use of CGM including how to scan, changing Sensor, Vitamin C warning, arrows with glucose readings, and Freestyle app. Gave patient Freestyle reader as well due to the app not being compatible with her phone.  Patient very appreciative.   Thank you, [name]. Tiler Brandis, RN, MSN, CDE  Diabetes Coordinator Inpatient Glycemic Control Team Team Pager [phone] (8am-5pm) 10/17/2022 1:57 PM

## 2022-10-17 NOTE — Plan of Care (Signed)
  Problem: Education: Goal: Knowledge of General Education information will improve Description: Including pain rating scale, medication(s)/side effects and non-pharmacologic comfort measures Outcome: Progressing   Problem: Health Behavior/Discharge Planning: Goal: Ability to manage health-related needs will improve Outcome: Progressing   Problem: Activity: Goal: Risk for activity intolerance will decrease Outcome: Progressing   Problem: Nutrition: Goal: Adequate nutrition will be maintained Outcome: Progressing   Problem: Elimination: Goal: Will not experience complications related to bowel motility Outcome: Progressing Goal: Will not experience complications related to urinary retention Outcome: Progressing   Problem: Pain Managment: Goal: General experience of comfort will improve Outcome: Progressing   Problem: Skin Integrity: Goal: Risk for impaired skin integrity will decrease Outcome: Progressing   Problem: Coping: Goal: Level of anxiety will decrease Outcome: Not Progressing   Problem: Coping: Goal: Level of anxiety will decrease Outcome: Not Progressing

## 2022-10-17 NOTE — Telephone Encounter (Signed)
Pharmacy Patient Advocate Encounter  Received notification from [employer] that Prior Authorization for FreeStyle Libre 3 Sensor  has been APPROVED from 10/17/2022 to 04/16/2023.Marland Kitchen  PA #/Case ID/Reference #: [id number]

## 2022-10-17 NOTE — Inpatient Diabetes Management (Addendum)
Inpatient Diabetes Program Recommendations  AACE/ADA: New Consensus Statement on Inpatient Glycemic Control (2015)  Target Ranges:  Prepandial:   less than 140 mg/dL      Peak postprandial:   less than 180 mg/dL (1-2 hours)      Critically ill patients:  140 - 180 mg/dL   Lab Results  Component Value Date   GLUCAP 164 (H) 10/17/2022   HGBA1C 12.1 (H) 10/16/2022    Review of Glycemic Control  Latest Reference Range & Units 10/17/22 04:18 10/17/22 05:16 10/17/22 07:18  Glucose-Capillary 70 - 99 mg/dL 161 (H) 096 (H) 045 (H)  (H): Data is abnormally high Diabetes history: New onset DM Outpatient Diabetes medications: none Current orders for Inpatient glycemic control: Novolog 0-15 units TID & HS, Semglee 27 units qd  Inpatient Diabetes Program Recommendations:    Spoke with patient at length regarding new onset DM.  Reviewed patient's current A1c of 12.1%. Explained what a A1c is and what it measures. Also reviewed goal A1c with patient, importance of good glucose control @ home, and blood sugar goals. Reviewed patho of DM, need for improved control, DKA, survival skills, interventions, vascular changes and commorbidities.  Patient will need glucose meter. Reviewed recommended frequency for checking. Patient does not have a PCP, so encouraged to make an appointment and provided instructions. Briefly discussed CGMs.  Admits to drinking sugary beverages. Reviewed alternatives, importance of protein and limiting CHO. Dietitian consult placed and outpt education referral. Secure chat sent to RN to allow for self injection.   Thanks, Lujean Rave, MSN, RNC-OB Diabetes Coordinator [phone] (8a-5p)

## 2022-10-17 NOTE — TOC Benefit Eligibility Note (Signed)
Pharmacy Patient Advocate Encounter  Insurance verification completed.    The patient is insured through [facility]   Ran test claim for Lantus Pen and the current 30 day co-pay is $0.00.  Ran test claim for Humalog KwikPen and the current 30 day co-pay is $0.00.  Ran test claim for [employer] and Requires Prior Authorization.  Ran test claim for [employer] and Requires Prior Authorization.  This test claim was processed through [facility]- copay amounts may vary at other pharmacies due to pharmacy/plan contracts, or as the patient moves through the different stages of their insurance plan.    Roland Earl, CPHT Pharmacy Patient Advocate Specialist [facility] Patient Advocate Team Direct Number: [phone]  Fax: [phone]

## 2022-10-17 NOTE — TOC Initial Note (Signed)
Transition of Care [facility]) - Initial/Assessment Note    Patient Details  Name: [name] MRN: [id number] Date of Birth: 09-05-1975  [facility] CM/SW Contact:    Leone Haven, RN Phone Number: 10/17/2022, 2:07 PM  Clinical Narrative:                 From home with children, she does not have a PCP, she has a follow up apt with the [facility] [address], she has insurance on file.  She currently does not have any HH services in place, or DME .   She states a family member will transport her home. She states her family is her support system. She gets her medications from [facility] on [address] [employer].  She is self ambulatory.        Patient Goals and CMS Choice            Expected Discharge Plan and Services                                              Prior Living Arrangements/Services                       Activities of Daily Living      Permission Sought/Granted                  Emotional Assessment              Admission diagnosis:  Diabetic ketoacidosis (HCC) [E11.10] DKA (diabetic ketoacidosis) (HCC) [E11.10] Diabetic ketoacidosis without coma associated with other specified diabetes mellitus (HCC) [E13.10] Patient Active Problem List   Diagnosis Date Noted   Diabetic ketoacidosis (HCC) 10/16/2022   DKA (diabetic ketoacidosis) (HCC) 10/16/2022   Hypertension 07/05/2021   Hyperlipidemia 07/05/2021   Morbid obesity (HCC)    Postpartum care following cesarean delivery - twins (2/20) 06/07/2013   PCP:  Patient, No Pcp Per Pharmacy:   [id number] [place], [place] - [address] AT [facility] [address] [place] [address] Phone: [phone] Fax: [phone]     Social Determinants of Health (SDOH) Social History: SDOH Screenings   Tobacco Use: Medium Risk (10/16/2022)   SDOH Interventions:     Readmission Risk Interventions     No data to display

## 2022-10-17 NOTE — Telephone Encounter (Signed)
Pharmacy Patient Advocate Encounter   Received notification that prior authorization for FreeStyle Libre 3 Sensor is required/requested.  PA submitted to [facility] via CoverMyMeds Key/confirmation #/EOC [id number] Status is pending

## 2022-10-17 NOTE — Progress Notes (Signed)
Subjective:  [name] is a 47 year old female with a past medical history of uncontrolled HTN, HLD, prediabetes, who is presenting for tachycardia and overall malaise who was admitted to the IMTS service for treatment of diabetic ketoacidosis.  She received insulin via endotool and has since progressed to basilar insulin and SSI with meals.  Today, she reports feeling fine and denies previous symptoms of rapid heartbeat, abdominal pain, feeling foggy, and tachypnea.  She also denies other symptoms such as palpitations heart fluttering and dizziness.  [name] requested more information about diabetes.  We discussed the function of the pancreas and the importance of determining whether she has type I or type 2 diabetes along with the basic treatment options DM.  We also stressed the importance of predisposing factors: family history, diet/weight. She understand that she will be using insulin when she leaves the hospital while we refine her treatment regimen. Along with treatment options, we stressed the importance of monitoring blood glucose levels.  Pt reports insurance previously denied Ozempic (initially prescribed for weight loss) as she had not been diagnosed with DM prior to this hospitalization.  Of note, she has a hx of gestational diabetes and a strong family history of diabetes.   Objective:  Vital signs in last 24 hours: Vitals:   10/16/22 1907 10/16/22 2014 10/17/22 0052 10/17/22 0426  BP: (!) 163/96 134/88 (!) 107/49 125/77  Pulse: (!) 108 (!) 106 99 90  Resp: 20 20 20 18   Temp: 98.1 F (36.7 C) 98.5 F (36.9 C) 98.5 F (36.9 C) 98.2 F (36.8 C)  TempSrc: Oral Oral Oral Oral  SpO2: 100% 98% 99% 100%  Weight: 106.7 kg   111.8 kg  Height: 5\' 8"  (1.727 m)      PE: Pt relaxing in bed, easily aroused  Cardiovascular: Rate and rhythm, no murmurs auscultated Pulmonary: Clear breath sounds, no increased work of breathing Abdominal: No tenderness on exam Pleasant mood       Latest Ref Rng & Units 10/17/2022    5:05 AM 10/16/2022    3:21 PM 10/16/2022   11:20 AM  CBC  WBC 4.0 - 10.5 K/uL 5.1   7.0   Hemoglobin 12.0 - 15.0 g/dL 16.1  09.6  04.5   Hematocrit 36.0 - 46.0 % 34.7  44.0  46.8   Platelets 150 - 400 K/uL 149   191        Latest Ref Rng & Units 10/17/2022   12:23 PM 10/17/2022    8:12 AM 10/17/2022    5:05 AM  CMP  Glucose 70 - 99 mg/dL 409  811  914   BUN 6 - 20 mg/dL <5  <5  <5   Creatinine 0.44 - 1.00 mg/dL 7.82  9.56  2.13   Sodium 135 - 145 mmol/L 130  138  137   Potassium 3.5 - 5.1 mmol/L 3.2  3.0  2.6   Chloride 98 - 111 mmol/L 102  106  110   CO2 22 - 32 mmol/L 20  21  19    Calcium 8.9 - 10.3 mg/dL 8.0  8.3  8.3     Assessment/Plan:  Principal Problem:   Diabetic ketoacidosis (HCC) Active Problems:   DKA (diabetic ketoacidosis) (HCC)  DKA/Hyperosmolar hyperglycemia Anion Gap Metabolic Acidosis  Patient with known prediabetes evaluated in the ED found to be hyperglycemic at 380 with bicarb of 8, and AG 24.  Further work up revealed BHB > 8, osmolality >300, and A1c of 12.1,  confirming diabetes and DKA/HHS diagnosis. Patient was mentating at baseline and admitted to our service. With hypokalemia, was started on K supplementation and EndoTool. -Diet with modified carbs -Completed Endo tool with resolution of DKA: Transitioned to basilar insulin glargine 27 units with the use of SSI insulin with meals -Continue to supplement potassium due to hypokalemia of 2.6 this morning -Type 1 DM antibodies ordered: GAD-65, IA2, ZnT8, insulin antibodies ordered, f/u with results -Diabetes coordinator spoke with pt regarding insulin administration and use of pens  Sinus tachycardia Patient was initially evaluated in the emergency department for tachycardia. Suspect this is in the setting of DKA/ hypovolemia now that pulmonary embolism and ACS causes have been ruled out. Will continue on cardiac telemetry to assess for arrhythmias at this time. No pain  or fever identified at this time. 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 However, giving the timing of tachycardia with DKA symptoms increases suspicion this is in setting of acidotic state. -Pulse has been under 100 since 7/3 -Continue home diltiazem therapy 180 mg nightly -Telemetry   Uncontrolled Hypertension Last seen in Dr. Mallory Shirk office earlier in the year with uncontrolled HTN in the setting of medication non adherence. SBP up to 180 and DBP up to 100, asymptomatic.  Uncontrolled hypertension on presentation is more likely due to medication adherence due to the response with 1 dose of diltiazem, pressures this morning are 125/77. -Home regimen:  Diltiazem and hydrochlorothiazide -Did confirm with patient that she is taking diltiazem and hydrochlorothiazide daily.  She denied any difficulties with obtaining her medication -Will resume home Diltiazem    AKI Resolved Patient does not have documented history of chronic renal disease. Elevated creatine in the emergency department was 1.22, has improved to 0.72.  The AKI was most likely due to hypovolemia in the setting of DKA due to osmotic diuresis -Will follow-up on RFP tomorrow morning  Obesity Patient has documented history of obesity with BMI of 35.78. Pt was educated on the predisposing risk factors of diabetes including obesity.      Diet: Modified carbs VTE: Enoxaparin Code: Full     Dispo: Anticipated discharge in approximately 1 day pending clinical improvement  Faith Rogue, DO 10/17/2022, 8:01 AM Pager: [phone] After 5pm on weekdays and 1pm on weekends: On Call pager [phone]

## 2022-10-18 LAB — LIPID PANEL
Cholesterol: 160 mg/dL (ref 0–200)
HDL: 31 mg/dL — ABNORMAL LOW
LDL Cholesterol: 111 mg/dL — ABNORMAL HIGH (ref 0–99)
Total CHOL/HDL Ratio: 5.2 ratio
Triglycerides: 92 mg/dL
VLDL: 18 mg/dL (ref 0–40)

## 2022-10-18 LAB — RENAL FUNCTION PANEL
Albumin: 2.9 g/dL — ABNORMAL LOW (ref 3.5–5.0)
Albumin: 2.9 g/dL — ABNORMAL LOW (ref 3.5–5.0)
Anion gap: 10 (ref 5–15)
Anion gap: 15 (ref 5–15)
BUN: <5 mg/dL — ABNORMAL LOW (ref 6–20)
BUN: <5 mg/dL — ABNORMAL LOW (ref 6–20)
CO2: 21 mmol/L — ABNORMAL LOW (ref 22–32)
CO2: 22 mmol/L (ref 22–32)
Calcium: 8.2 mg/dL — ABNORMAL LOW (ref 8.9–10.3)
Calcium: 8.4 mg/dL — ABNORMAL LOW (ref 8.9–10.3)
Chloride: 103 mmol/L (ref 98–111)
Chloride: 100 mmol/L (ref 98–111)
Creatinine, Ser: 0.69 mg/dL (ref 0.44–1.00)
Creatinine, Ser: 0.7 mg/dL (ref 0.44–1.00)
GFR, Estimated: >60 mL/min
GFR, Estimated: >60 mL/min
Glucose, Bld: 236 mg/dL — ABNORMAL HIGH (ref 70–99)
Glucose, Bld: 293 mg/dL — ABNORMAL HIGH (ref 70–99)
Phosphorus: 2.2 mg/dL — ABNORMAL LOW (ref 2.5–4.6)
Phosphorus: 2.3 mg/dL — ABNORMAL LOW (ref 2.5–4.6)
Potassium: 3.2 mmol/L — ABNORMAL LOW (ref 3.5–5.1)
Potassium: 3.1 mmol/L — ABNORMAL LOW (ref 3.5–5.1)
Sodium: 134 mmol/L — ABNORMAL LOW (ref 135–145)
Sodium: 137 mmol/L (ref 135–145)

## 2022-10-18 LAB — CBC
HCT: 33.3 % — ABNORMAL LOW (ref 36.0–46.0)
Hemoglobin: 11.2 g/dL — ABNORMAL LOW (ref 12.0–15.0)
MCH: 29.2 pg (ref 26.0–34.0)
MCHC: 33.6 g/dL (ref 30.0–36.0)
MCV: 86.9 fL (ref 80.0–100.0)
Platelets: 150 10*3/uL (ref 150–400)
RBC: 3.83 MIL/uL — ABNORMAL LOW (ref 3.87–5.11)
RDW: 12.4 % (ref 11.5–15.5)
WBC: 4.8 10*3/uL (ref 4.0–10.5)
nRBC: 0 % (ref 0.0–0.2)

## 2022-10-18 LAB — GLUCOSE, CAPILLARY
Glucose-Capillary: 279 mg/dL — ABNORMAL HIGH (ref 70–99)
Glucose-Capillary: 219 mg/dL — ABNORMAL HIGH (ref 70–99)
Glucose-Capillary: 217 mg/dL — ABNORMAL HIGH (ref 70–99)

## 2022-10-18 MED ORDER — INSULIN GLARGINE-YFGN 100 UNIT/ML ~~LOC~~ SOLN
36.0000 [IU] | Freq: Every day | SUBCUTANEOUS | Status: DC
  Filled 2022-10-18: qty 0.36

## 2022-10-18 MED ORDER — POTASSIUM CHLORIDE CRYS ER 20 MEQ PO TBCR: 40.0000 meq | EXTENDED_RELEASE_TABLET | Freq: Once | ORAL | 0 refills | Status: DC

## 2022-10-18 MED ORDER — IRBESARTAN 75 MG PO TABS: 75.0000 mg | ORAL_TABLET | Freq: Every day | ORAL | 2 refills | Status: DC

## 2022-10-18 MED ORDER — METFORMIN HCL ER 500 MG PO TB24: 500.0000 mg | ORAL_TABLET | Freq: Every day | ORAL | 2 refills | Status: AC

## 2022-10-18 MED ORDER — INSULIN GLARGINE-YFGN 100 UNIT/ML ~~LOC~~ SOLN
35.0000 [IU] | Freq: Every day | SUBCUTANEOUS | Status: DC
  Administered 2022-10-18: 35 [IU] via SUBCUTANEOUS
  Filled 2022-10-18: qty 0.35

## 2022-10-18 MED ORDER — ROSUVASTATIN CALCIUM 10 MG PO TABS: 10.0000 mg | ORAL_TABLET | Freq: Every day | ORAL | 2 refills | Status: DC

## 2022-10-18 MED ORDER — INSULIN GLARGINE 100 UNIT/ML SOLOSTAR PEN: 35.0000 [IU] | PEN_INJECTOR | Freq: Every day | SUBCUTANEOUS | 2 refills | Status: DC

## 2022-10-18 MED ORDER — INSULIN PEN NEEDLE 32G X 4 MM MISC: 3 refills | Status: AC

## 2022-10-18 MED ORDER — POTASSIUM CHLORIDE CRYS ER 20 MEQ PO TBCR
40.0000 meq | EXTENDED_RELEASE_TABLET | Freq: Once | ORAL | Status: AC
  Administered 2022-10-18: 40 meq via ORAL
  Filled 2022-10-18: qty 2

## 2022-10-18 NOTE — Discharge Summary (Addendum)
Name: [name] MRN: [id number] DOB: Mar 20, 1976 47 y.o. PCP: Patient, No Pcp Per  Date of Admission: 10/16/2022 10:22 AM Date of Discharge: 10/18/2022 11:15 AM Attending Physician: Dr. Criselda Peaches  Discharge Diagnosis: Principal Problem:   Diabetic ketoacidosis (HCC) Active Problems:   DKA (diabetic ketoacidosis) (HCC)    Discharge Medications: Allergies as of 10/18/2022   No Known Allergies      Medication List     STOP taking these medications    hydrochlorothiazide 25 MG tablet Commonly known as: HYDRODIURIL       TAKE these medications    diltiazem 180 MG 24 hr tablet Commonly known as: CARDIZEM LA Take 180 mg by mouth daily. What changed: Another medication with the same name was removed. Continue taking this medication, and follow the directions you see here.   insulin glargine 100 UNIT/ML Solostar Pen Commonly known as: LANTUS Inject 35 Units into the skin daily.   Insulin Pen Needle 32G X 4 MM Misc Change with every use   irbesartan 75 MG tablet Commonly known as: Avapro Take 1 tablet (75 mg total) by mouth daily.   metFORMIN 500 MG 24 hr tablet Commonly known as: GLUCOPHAGE-XR Take 1 tablet (500 mg total) by mouth daily with breakfast.   rosuvastatin 10 MG tablet Commonly known as: Crestor Take 1 tablet (10 mg total) by mouth daily.        Disposition and follow-up:   [name] was discharged from [facility] in Stable condition.  At the hospital follow up visit please address:  1.  Follow-up:  *Diabetic Keto Acidosis, New diagnosis of Diabetes -Ensure adherence to insulin glargine injection subcutaneous, once per day -Ensure she monitors blood glucose with CGM free style libre -Started on metformin 500 mg once daily -Follow up with [facility] clinic located at [facility] -Follow up with [facility] at [place] on 12/14/2022 at  9:15AM   *Hypertension -Ensure adherence to Irbesartan 75 mg -Ensure adherence to diltiazem 180mg   -Discontinued home hydrochlorothiazide 25mg   -Follow up with Cardiologist at [facility] at [facility] on 11/07/2022 on 9:15AM   *Hyperlipidemia -Ensure adherence to Crestor 10 mg  -LDL goal<100   *Obesity -Discuss strategies to limit predisposing risk factors such as weight loss to decrease the risk of cardiovascular disease in the setting of diabetes and HTN -Follow up with [place] [facility] at [place] on 12/14/2022 at 9:15AM   2.  Labs / imaging needed at time of follow-up: CBC, RFP   3.  Pending labs/ test needing follow-up: ZNT8 antibodies, GAD65 antibodies, IA-2 antibodies, insulin antibodies  4.  Medication Changes  STOPPED  -Hydrochlorothiazide    ADDED  -Insulin Glargine: once daily -Insulin needle pens  -Irbesartan 75mg  once daily  -Crestor 10mg      Follow-up Appointments:  Follow-up Information     [place] [facility] at [facility] .   Specialty: Emergency Medicine Why: As needed Contact information: [address] [id number] mc [place] [address] [phone]               Patient will follow up with [facility] in the next 1-2 weeks here at [facility].  Given today is a holiday, we will contact patient with appointment time and date.   Hospital Course by problem list: #DKA #Anion Gap Metabolic Acidosis  Patient with known prediabetes evaluated in the ED found to be hyperglycemic at 380 with  bicarb of 8, and AG 24.  Further work up revealed BHB > 8, osmolality >300, and A1c of 12.1, confirming diabetes and DKA/HHS diagnosis. Patient was mentating at baseline and admitted to our service. With hypokalemia, was started on IVF, K supplementation, and EndoTool. After her anion gap closed twice, we transitioned her to 27 units of basal insulin and regular diet. Her  basal insulin was increased to 35 units. A diabetes coordinator provided her with education on insulin administration along with nutrition education for the dietician.  She will go home on insulin glargine 35 units daily and metformin 500mg  daily.  She has labwork that is pending to see if she is a late Copywriter, advertising of type 1 DM2.  She further had a statin added to her regimen and lipid panel will be reviewed in the outpatient setting.   #Sinus Tachycardia- Resolved  Patient was initially evaluated in the emergency department for tachycardia. Suspect this is in the setting of DKA/ hypovolemia now that pulmonary embolism and ACS causes have been ruled out. Will continue on cardiac telemetry to assess for arrhythmias at this time. No pain or fever identified at this time. 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 However, giving the timing of tachycardia with DKA symptoms increases suspicion this is in setting of acidotic state. Patient was provided IVF and home diltiazem during hospitalization. Most recent heart rate is 87.   #Uncontrolled Hypertension: Last seen in Dr. Mallory Shirk office earlier in the year with uncontrolled HTN in the setting of medication non adherence. SBP up to 180 and DBP up to 100, asymptomatic on admission. She did well with addition of her home diltiazem.  Given her diabetes and hypokalemia with insulin administration, we discussed started her on ARB therapy.  She was discharged on diltiazem and irbesartan.    #AKI- Resolved Elevated creatine in the emergency department was 1.22, has improved to 0.7 on day of discharge. The AKI was most likely due to hypovolemia in the setting of DKA due to osmotic diuresis.  #Hyperlipidemia  Lipid panel revealed total cholestrol of 160 and an LDL of 111. She will be started on Crestor upon discharge   Hypokalemia Related to insulin use.  We supplemented the K on day of  discharge and also added an ARB to her medication list.  Recheck BMET in the outpatient clinic.     Discharge Subjective:  Today, she is feeling better and does not have complaints today. She would like to go home. We did provide her additional education on eating more protein and less carbohydrates to limit spikes in glucose.  Patient has not tried losartan in the past but has tried amlodipine.  She approved beginning irbesartan and discontinuing hydrochlorothiazide. Patient prefers to follow up with [place] in clinic.   Discharge Exam:   Blood pressure (!) 151/88, pulse 85, temperature 98 F (36.7 C), temperature source Oral, resp. rate 18, height 5\' 8"  (1.727 m), weight 112.4 kg, SpO2 98 %.  Constitutional:Non ill-appearing comfortable sitting in bed, in no acute distress HENT: normocephalic atraumatic, mucous membranes moist Cardiovascular: regular rate and rhythm, no m/r/g Pulmonary/Chest: normal work of breathing on room air, lungs clear to auscultation bilaterally. No crackles  Abdominal: soft, non-tender, non-distended.  Neurological: alert & oriented x 3 MSK: no gross abnormalities Skin: warm and dry Psych: Normal mood and affect  Pertinent Labs, Studies, and Procedures:     Latest Ref Rng & Units 10/18/2022  12:14 AM 10/17/2022    5:05 AM 10/16/2022    3:21 PM  CBC  WBC 4.0 - 10.5 K/uL 4.8  5.1    Hemoglobin 12.0 - 15.0 g/dL 16.1  09.6  04.5   Hematocrit 36.0 - 46.0 % 33.3  34.7  44.0   Platelets 150 - 400 K/uL 150  149         Latest Ref Rng & Units 10/18/2022    6:20 AM 10/18/2022   12:14 AM 10/17/2022   10:03 PM  CMP  Glucose 70 - 99 mg/dL 409  811  914   BUN 6 - 20 mg/dL <5  <5  6   Creatinine 0.44 - 1.00 mg/dL 7.82  9.56  2.13   Sodium 135 - 145 mmol/L 137  134  134   Potassium 3.5 - 5.1 mmol/L 3.1  3.2  3.3   Chloride 98 - 111 mmol/L 100  103  100   CO2 22 - 32 mmol/L 22  21  21    Calcium 8.9 - 10.3 mg/dL 8.4  8.2  8.5     CT Angio Chest PE W and/or Wo  Contrast  Result Date: 10/16/2022 CLINICAL DATA:  Pulmonary embolism (PE) suspected, low to intermediate prob, positive D-dimer EXAM: CT ANGIOGRAPHY CHEST WITH CONTRAST TECHNIQUE: Multidetector CT imaging of the chest was performed using the standard protocol during bolus administration of intravenous contrast. Multiplanar CT image reconstructions and MIPs were obtained to evaluate the vascular anatomy. RADIATION DOSE REDUCTION: This exam was performed according to the departmental dose-optimization program which includes automated exposure control, adjustment of the mA and/or kV according to patient size and/or use of iterative reconstruction technique. CONTRAST:  75mL OMNIPAQUE IOHEXOL 350 MG/ML SOLN COMPARISON:  None Available. FINDINGS: Cardiovascular: Satisfactory opacification of the pulmonary arteries to the segmental level. No evidence of pulmonary embolism. Normal heart size. No pericardial effusion. Mediastinum/Nodes: No enlarged mediastinal, hilar, or axillary lymph nodes. Thyroid gland, trachea, and esophagus demonstrate no significant findings. Lungs/Pleura: Lungs are clear. No pleural effusion or pneumothorax. Upper Abdomen: No acute abnormality. Musculoskeletal: No chest wall abnormality. No acute or significant osseous findings. Review of the MIP images confirms the above findings. IMPRESSION: 1. No evidence of pulmonary embolism. 2. No acute intrathoracic process. Electronically Signed   By: Lorenza Cambridge M.D.   On: 10/16/2022 15:31   DG Chest Portable 1 View  Result Date: 10/16/2022 CLINICAL DATA:  Chest pain EXAM: PORTABLE CHEST 1 VIEW COMPARISON:  03/03/2012 FINDINGS: The cardiomediastinal silhouette is unremarkable. There is no evidence of focal airspace disease, pulmonary edema, suspicious pulmonary nodule/mass, pleural effusion, or pneumothorax. No acute bony abnormalities are identified. IMPRESSION: No active disease. Electronically Signed   By: Harmon Pier M.D.   On: 10/16/2022 11:36      Discharge Instructions: Discharge Instructions     Ambulatory referral to Nutrition and Diabetic Education   Complete by: As directed    Call MD for:  difficulty breathing, headache or visual disturbances   Complete by: As directed    Call MD for:  extreme fatigue   Complete by: As directed    Call MD for:  persistant dizziness or light-headedness   Complete by: As directed    Call MD for:  persistant nausea and vomiting   Complete by: As directed    Call MD for:  redness, tenderness, or signs of infection (pain, swelling, redness, odor or green/yellow discharge around incision site)   Complete by: As directed    Call MD  for:  severe uncontrolled pain   Complete by: As directed    Call MD for:  temperature >100.4   Complete by: As directed    Diet - low sodium heart healthy   Complete by: As directed    Discharge instructions   Complete by: As directed    Dear [name]   You were admitted to the hospital after presenting with a fast heart rate and not feeling well. You were diagnosed with diabetes mellitus after having diabetic keto acidosis (DKA). You were treated with insulin in an IV. We are starting you on these new medications:  -For your diabetes, you will start the following medication: - Insulin: you will inject 35 units subcutaneously once a day - morning or night. You can choose. -Metformin 500 mg daily  For hypertension -  -Irbesartan 75 mg once daily - this a new medication for you -Continue taking your Cardizem 180 mg nightly  For cholesterol - Crestor 20 mg daily  These medications [facility] [address]. Please keep track of your blood glucose with the use of the free style libre (continuous glucose monitor on your arm). You should also continue to monitor your blood pressure and maintain it at or lower than 130/80.  Our [facility] Center clinic located at [facility] will call you to make an appointment in the next 2  weeks. If you have any questions or concerns, please reach [place] at  [phone]  If your blood sugar levels are high and/or you do not feel well, changes to vision, chest pain, or blood in the urine, please be evaluated in an emergency department.   It was a pleasure caring for you,  Faith Rogue, DO   Increase activity slowly   Complete by: As directed        Signed: Faith Rogue DO [facility] - PGY1 Pager: [phone] 10/18/2022, 11:15 AM    Please contact the on call pager after 5 pm and on weekends at [phone].

## 2022-10-18 NOTE — Plan of Care (Signed)
  RD consulted for nutrition education regarding diabetes. Patient is drowsy at time of visit so dietary recall is limited. She plans to reduce portion sizes of carbohydrates as she does not like the way she feels with elevated blood sugars. She was provided continuous glucose monitor by Diabetes Coordinator and is excited to start using this device.   Lab Results  Component Value Date   HGBA1C 12.1 (H) 10/16/2022    RD provided "Plate Method" and "Carbohydrate Counting for People with Diabetes" handout from the Academy of Nutrition and Dietetics. Discussed different food groups and their effects on blood sugar, emphasizing carbohydrate-containing foods. Provided list of carbohydrates and recommended serving sizes of common foods.  Discussed importance of controlled and consistent carbohydrate intake throughout the day. Provided examples of ways to balance meals/snacks and encouraged intake of high-fiber, whole grain complex carbohydrates. Teach back method used.  Expect good compliance. Patient would benefit from ongoing outpatient diet education. Referral placed.   Body mass index is 37.69 kg/m. Pt meets criteria for obesity based on current BMI.  Current diet order is Carb Modified, patient is consuming approximately 100% of meals at this time. Labs and medications reviewed. No further nutrition interventions warranted at this time. RD contact information provided. If additional nutrition issues arise, please re-consult RD.  [name], RDN, LDN Clinical Nutrition

## 2022-10-19 LAB — GLUTAMIC ACID DECARBOXYLASE AUTO ABS: Glutamic Acid Decarb Ab: <5 U/mL (ref 0.0–5.0)

## 2022-10-28 LAB — IA-2 AUTOANTIBODIES: IA-2 Autoantibodies: <7.5 U/mL

## 2022-10-28 LAB — INSULIN ANTIBODIES, BLOOD: Insulin Antibodies, Human: <5 uU/mL

## 2022-10-29 ENCOUNTER — Encounter: Payer: Self-pay | Admitting: Student

## 2022-10-29 ENCOUNTER — Other Ambulatory Visit: Payer: Self-pay

## 2022-10-29 ENCOUNTER — Ambulatory Visit (INDEPENDENT_AMBULATORY_CARE_PROVIDER_SITE_OTHER): Payer: No Typology Code available for payment source | Admitting: Student

## 2022-10-29 VITALS — BP 144/81 | HR 67 | Temp 98.0°F | Ht 65.0 in | Wt 255.5 lb

## 2022-10-29 DIAGNOSIS — E1165 Type 2 diabetes mellitus with hyperglycemia: Secondary | ICD-10-CM | POA: Diagnosis not present

## 2022-10-29 DIAGNOSIS — E785 Hyperlipidemia, unspecified: Secondary | ICD-10-CM

## 2022-10-29 DIAGNOSIS — Z794 Long term (current) use of insulin: Secondary | ICD-10-CM

## 2022-10-29 DIAGNOSIS — Z7985 Long-term (current) use of injectable non-insulin antidiabetic drugs: Secondary | ICD-10-CM

## 2022-10-29 DIAGNOSIS — Z6841 Body Mass Index (BMI) 40.0 and over, adult: Secondary | ICD-10-CM

## 2022-10-29 DIAGNOSIS — I10 Essential (primary) hypertension: Secondary | ICD-10-CM

## 2022-10-29 LAB — ZNT8 ANTIBODIES: ZNT8 Antibodies: <15 U/mL

## 2022-10-29 MED ORDER — FREESTYLE LIBRE 3 SENSOR MISC
2 refills | Status: DC
Start: 2022-10-29 — End: 2023-01-04

## 2022-10-29 MED ORDER — SEMAGLUTIDE(0.25 OR 0.5MG/DOS) 2 MG/3ML ~~LOC~~ SOPN
0.2500 mg | PEN_INJECTOR | SUBCUTANEOUS | 3 refills | Status: DC
Start: 2022-10-29 — End: 2022-11-19

## 2022-10-29 MED ORDER — NOVOLOG FLEXPEN 100 UNIT/ML ~~LOC~~ SOPN
5.0000 [IU] | PEN_INJECTOR | Freq: Three times a day (TID) | SUBCUTANEOUS | 11 refills | Status: DC
Start: 2022-10-29 — End: 2022-10-31

## 2022-10-29 MED ORDER — IRBESARTAN 75 MG PO TABS
150.0000 mg | ORAL_TABLET | Freq: Every day | ORAL | 2 refills | Status: DC
Start: 2022-10-29 — End: 2023-06-04

## 2022-10-29 NOTE — Assessment & Plan Note (Signed)
Body mass index is 42.52 kg/m.  Counseled about nutrition including eliminating sugary beverages like soda, sweet tea, and juice from diet. Recommended more vegetables, lean protein, and legumes. Frozen vegetables are healthy and inexpensive. Black beans are a healthy and inexpensive source of lean protein and fiber. Recommend gradually increasing exercise. A daily walk is a great way to start an exercise program.  Referral: yes  Pharmacological intervention: semaglutide 0.25 mg weekly

## 2022-10-29 NOTE — Progress Notes (Deleted)
This is a Psychologist, occupational Note.  The care of the patient was discussed with Dr. Marland Kitchen and the assessment and plan was formulated with their assistance.  Please see their note for official documentation of the patient encounter.   Subjective:   Patient ID: [name] female   DOB: Dec 09, 1975 47 y.o.   MRN: [id number]  HPI: [name] is a 47 y.o.   For the details of today's visit, please refer to the assessment and plan.   Diabetic Keto Acidosis, New diagnosis of Diabetes -Ensure adherence to insulin glargine injection subcutaneous, once per day -Ensure she monitors blood glucose with CGM free style libre -Started on metformin 500 mg once daily -Follow up with [facility] clinic located at [facility] -Follow up with [facility] at [place] on 12/14/2022 at 9:15AM     *Hypertension -Ensure adherence to Irbesartan 75 mg -Ensure adherence to diltiazem 180mg   -Discontinued home hydrochlorothiazide 25mg  y -Follow up with Cardiologist at [facility] on 11/07/2022 on 9:15AM   Taking meds Metformin vs glipizide question Insulin pen question Mounjaro interest for weightloss Diltiazem 180mg  dosage question    *Hyperlipidemia -Ensure adherence to Crestor 10 mg  -LDL goal<100     *Obesity -Discuss strategies to limit predisposing risk factors such as weight loss to decrease the risk of cardiovascular disease in the setting of diabetes and HTN  CBC, RFP    3.  Pending labs/ test needing follow-up: ZNT8 antibodies, GAD65 antibodies, IA-2 antibodies, insulin antibodies   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   Gestational diabetes    Hypertension    no meds now   Mild or unspecified pre-eclampsia, with delivery 06/07/2013   Morbid obesity (HCC)    Postpartum care following cesarean delivery - twins (2/20) 06/07/2013    Current Outpatient Medications  Medication Sig Dispense Refill   diltiazem (CARDIZEM LA) 180 MG 24 hr tablet Take 180 mg by mouth daily.     insulin glargine (LANTUS) 100 UNIT/ML Solostar Pen Inject 35 Units into the skin daily. 15 mL 2   Insulin Pen Needle 32G X 4 MM MISC Change with every use 100 each 3   irbesartan (AVAPRO) 75 MG tablet Take 1 tablet (75 mg total) by mouth daily. 30 tablet 2   metFORMIN (GLUCOPHAGE-XR) 500 MG 24 hr tablet Take 1 tablet (500 mg total) by mouth daily with breakfast. 30 tablet 2   potassium chloride SA (KLOR-CON M) 20 MEQ tablet Take 2 tablets (40 mEq total) by mouth once for 1 dose. 2 tablet 0   rosuvastatin (CRESTOR) 10 MG tablet Take 1 tablet (10 mg total) by mouth daily. 30 tablet 2   No current facility-administered medications for this visit.   Family History  Problem Relation Age of Onset   Cancer Mother        lymphoma   Diabetes Mother    Hypertension Mother    High Cholesterol Mother    Stroke Mother    Heart disease Mother    Healthy Father    Social History   Socioeconomic History   Marital status: Single    Spouse name: Not on file   Number of children: Not on file   Years of education: Not on file   Highest education level: Not on file  Occupational History   Not on file  Tobacco Use   Smoking status:  Former    Current packs/day: 0.00    Types: Cigarettes    Start date: 10/04/2002    Quit date: 10/03/2012    Years since quitting: 10.0   Smokeless tobacco: Never   Tobacco comments:    1 pack a week  Vaping Use   Vaping status: Never Used  Substance and Sexual Activity   Alcohol use: Yes    Comment: social   Drug use: No   Sexual activity: Yes    Birth control/protection: Condom  Other Topics Concern   Not on file  Social History Narrative   Not on file   Social Determinants of Health   Financial Resource Strain: Not on file  Food Insecurity: Not on file  Transportation Needs: Not on file  Physical Activity:  Not on file  Stress: Not on file  Social Connections: Not on file   Review of Systems: {Review Of Systems:30496} Objective:  Physical Exam: Vitals:   10/29/22 1553  BP: (!) 144/81  Pulse: 67  Temp: 98 F (36.7 C)  TempSrc: Oral  SpO2: 100%  Weight: 255 lb 8 oz (115.9 kg)  Height: 5\' 5"  (1.651 m)    Constitutional: NAD, appears comfortable HEENT: Atraumatic, normocephalic. PERRL, anicteric sclera.  Neck: Supple, trachea midline.  Cardiovascular: RRR, no murmurs, rubs, or gallops.  Pulmonary/Chest: CTAB, no wheezes, rales, or rhonchi. No chest wall abnormalities.  Abdominal: Soft, non tender, non distended. +BS.  GU: ***  Extremities: Warm and well perfused. Distal pulses intact. No edema.  Neurological: A&Ox3, CN II - XII grossly intact.  Skin: No rashes or erythema  Psychiatric: Normal mood and affect  Assessment & Plan:   No problem-specific Assessment & Plan notes found for this encounter.

## 2022-10-29 NOTE — Progress Notes (Signed)
Subjective:  [name] is a 47 y.o. who presents to clinic for the following:  Follow-up after hospitalization from July 2 to October 18, 2022 for newly diagnosed diabetes and DKA.  Discharged with CGM, glargine, metformin.  Reviewed CGM results, she is still struggling with pretty significant hyperglycemia that seems to be worse after lunch and dinner hours.  Reports good adherence to medicines.  Diagnosis and change to her lifestyle has clearly been a lot for her to handle but she seems to be compensating well.  Medical history notable for obesity, gestational diabetes, hypertension, preeclampsia.  Medications at time of encounter include diltiazem 180 mg, insulin glargine 35 units daily, irbesartan 75 mg daily, metformin 500 mg daily, rosuvastatin 10 mg daily  Surgical history notable for cesarean section in 2015 and partial mastectomy of right breast in 2013 for lump, pathology was benign  Lives in [place] with her children.  Works as a Warden/ranger.  Former smoker, quit in 2014.  Rare alcohol use.  Objective:   Vitals:   10/29/22 1553  BP: (!) 144/81  Pulse: 67  Temp: 98 F (36.7 C)  TempSrc: Oral  SpO2: 100%  Weight: 255 lb 8 oz (115.9 kg)  Height: 5\' 5"  (1.651 m)    Physical Exam Well-appearing, no distress Heart rate is normal, rhythm is regular, radial pulses, dorsalis pedis, and posterior tibialis pulses are strong Breathing is regular and unlabored on room air Skin is warm and dry Alert and oriented Pleasant, concordant affect  Assessment & Plan:   Problem List Items Addressed This Visit     Morbid obesity (HCC)    Body mass index is 42.52 kg/m.  Counseled about nutrition including eliminating sugary beverages like soda, sweet tea, and juice from diet. Recommended more vegetables, lean protein, and legumes. Frozen vegetables are healthy and inexpensive. Black beans are a healthy and inexpensive source of lean protein and fiber. Recommend gradually  increasing exercise. A daily walk is a great way to start an exercise program.  Referral: yes  Pharmacological intervention: semaglutide 0.25 mg weekly       Relevant Medications   insulin aspart (NOVOLOG FLEXPEN) 100 UNIT/ML FlexPen   Semaglutide,0.25 or 0.5MG /DOS, 2 MG/3ML SOPN   Hypertension - Primary    Hypertensive today.  On a very low dose of irbesartan, which I will increase today.  Sees cardiology who prescribes diltiazem.  I want her to monitor blood pressure at home.  Still room to increase irbesartan if BP remains elevated.      Relevant Medications   irbesartan (AVAPRO) 75 MG tablet   Other Relevant Orders   Basic metabolic panel   Hyperlipidemia    Continue rosuvastatin 10 mg, repeat lipid panel after September 2024.      Relevant Medications   irbesartan (AVAPRO) 75 MG tablet   Type 2 diabetes mellitus with hyperglycemia, without long-term current use of insulin (HCC)    [name] wore the CGM for 13 days. The average reading was 220, % time in target was 50, % time below target was 0, and % time above target was 50. Intervention will be to add mealtime insulin and GLP-1 receptor agonist. The patient will be scheduled to see me in 1 week for another appointment.  Hyperglycemic despite good adherence to regimen. Spikes after lunch and dinner hours. Counseled her to eliminate sugary beverages from diet and add more vegetables, lean proteins, and legumes to her diet. She seems like a savvy person who can handle  the addition of mealtime insulin for the time being. Recommend 5 units pre-meal and she can up titrate to 10 units pre-meal before seeing me next week if sugars are consistently elevated above 180 after eating. Will add semaglutide today to increase as tolerated.       Relevant Medications   Continuous Glucose Sensor (FREESTYLE LIBRE 3 SENSOR) MISC   insulin aspart (NOVOLOG FLEXPEN) 100 UNIT/ML FlexPen   Semaglutide,0.25 or 0.5MG /DOS, 2 MG/3ML SOPN    irbesartan (AVAPRO) 75 MG tablet   Other Relevant Orders   Referral to Nutrition and Diabetes Services    Return 2 weeks for diabetes and hypertension.  Patient discussed with Dr. Lawernce Keas MD 10/29/2022, 6:11 PM  [phone]

## 2022-10-29 NOTE — Assessment & Plan Note (Signed)
[name] wore the CGM for 13 days. The average reading was 220, % time in target was 50, % time below target was 0, and % time above target was 50. Intervention will be to add mealtime insulin and GLP-1 receptor agonist. The patient will be scheduled to see me in 1 week for another appointment.  Hyperglycemic despite good adherence to regimen. Spikes after lunch and dinner hours. Counseled her to eliminate sugary beverages from diet and add more vegetables, lean proteins, and legumes to her diet. She seems like a savvy person who can handle the addition of mealtime insulin for the time being. Recommend 5 units pre-meal and she can up titrate to 10 units pre-meal before seeing me next week if sugars are consistently elevated above 180 after eating. Will add semaglutide today to increase as tolerated.

## 2022-10-29 NOTE — Assessment & Plan Note (Signed)
Continue rosuvastatin 10 mg, repeat lipid panel after September 2024.

## 2022-10-29 NOTE — Assessment & Plan Note (Addendum)
Hypertensive today.  On a very low dose of irbesartan, which I will increase today.  Sees cardiology who prescribes diltiazem.  I want her to monitor blood pressure at home.  Still room to increase irbesartan if BP remains elevated.

## 2022-10-29 NOTE — Patient Instructions (Signed)
Remember to bring all of the medications that you take (including over the counter medications and supplements) with you to every clinic visit.  This after visit summary is an important review of tests, referrals, and medication changes that were discussed during your visit. If you have questions or concerns, call [phone]. Outside of clinic business hours, call the main hospital at [phone] and ask the operator for the on-call internal medicine resident.   Marrianne Mood MD 10/29/2022, 5:03 PM

## 2022-10-30 ENCOUNTER — Telehealth: Payer: Self-pay

## 2022-10-30 NOTE — Progress Notes (Signed)
Internal Medicine Clinic Attending  Case discussed with the resident physician at the time of the visit.  We reviewed the patient's history, exam, and pertinent patient test results.  I agree with the assessment, diagnosis, and plan of care documented in the resident's note.

## 2022-10-30 NOTE — Telephone Encounter (Signed)
Decision:Approved [place] [employer] (Key: [id number]) Ozempic (0.25 or 0.5 MG/DOSE) 2MG /3ML pen-injectors Form [employer] Electronic Prior Authorization Form (2017 NCPDP) Created Message from Plan Request Reference Number: [id number]. OZEMPIC INJ 2MG /3ML is approved through 10/30/2023. Your patient may now fill this prescription and it will be covered.. Authorization Expiration Date: October 30, 2023.

## 2022-10-30 NOTE — Telephone Encounter (Signed)
Prior Authorization for patient (Ozempic) came through on cover my meds was submitted with last office notes and labs awaiting approval or denial.  [id number]

## 2022-10-31 ENCOUNTER — Telehealth: Payer: Self-pay

## 2022-10-31 ENCOUNTER — Telehealth (INDEPENDENT_AMBULATORY_CARE_PROVIDER_SITE_OTHER): Payer: Self-pay | Admitting: Primary Care

## 2022-10-31 LAB — BASIC METABOLIC PANEL
BUN/Creatinine Ratio: 9 (ref 9–23)
BUN: 6 mg/dL (ref 6–24)
CO2: 19 mmol/L — ABNORMAL LOW (ref 20–29)
Calcium: 9.6 mg/dL (ref 8.7–10.2)
Chloride: 104 mmol/L (ref 96–106)
Creatinine, Ser: 0.69 mg/dL (ref 0.57–1.00)
Glucose: 128 mg/dL — ABNORMAL HIGH (ref 70–99)
Potassium: 4.2 mmol/L (ref 3.5–5.2)
Sodium: 140 mmol/L (ref 134–144)
eGFR: 109 mL/min/{1.73_m2} (ref >59)

## 2022-10-31 MED ORDER — INSULIN LISPRO 100 UNIT/ML IJ SOLN: INTRAMUSCULAR | 3 refills | Status: DC

## 2022-10-31 NOTE — Telephone Encounter (Addendum)
Prior Authorization for patient (Insulin aspart flexpen/Novolog flexpen) came through on cover my meds was submitted with last office notes and labs awaiting approval or denial.  AOZ:[id number]

## 2022-10-31 NOTE — Telephone Encounter (Signed)
Spoke to pt.Pt wanted to cancel apt

## 2022-10-31 NOTE — Telephone Encounter (Signed)
Incoming fax from pharmacy Medication: Insulin aspart flex pen/novolog flex pen Message to prescriber: "Drug change request, plan does not cover medication prescribed"

## 2022-10-31 NOTE — Addendum Note (Signed)
Addended by: Marrianne Mood on: 10/31/2022 03:30 PM   Modules accepted: Orders

## 2022-11-01 ENCOUNTER — Inpatient Hospital Stay (INDEPENDENT_AMBULATORY_CARE_PROVIDER_SITE_OTHER): Payer: No Typology Code available for payment source | Admitting: Primary Care

## 2022-11-05 NOTE — Telephone Encounter (Signed)
Decision:Denied This request was denied because you did not meet the following requirements: The requested medication is not covered because it is not on the listing or formulary of approved drugs for your plan benefit. Please discuss alternative drug therapy with your doctor. The request for coverage for NOVOLOG INJ FLEXPEN, use as directed (15 per prescription), is denied. This decision is based on health plan criteria for NOVOLOG INJ FLEXPEN. This medicine is covered only if: You have a history of failure after at least 3 month trial, contraindication, or intolerance (list reason for therapeutic failure, contraindication, or intolerance) to Humalog or Insulin Lispro (unbranded Humalog). The information provided does not show that you meet the criteria listed above. Reviewed by: [email or web address], R.Ph. The reason(s) [facility] did not approve this medication can be found above. This denial is based on our Novolog Flexpen drug coverage policy, in addition to any supplementary information you or your prescriber may have submitted.

## 2022-11-06 NOTE — Progress Notes (Unsigned)
Cardiology Clinic Note   Date: 11/07/2022 ID: Trinitey Roache, DOB 07/18/1975, MRN [id number]  Primary Cardiologist:  Thomasene Ripple, DO  Patient Profile    [name] is a 47 y.o. female who presents to the clinic today for hospital follow up.     Past medical history significant for: Palpitations. 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  Symptomatic rare PVCs. Hypertension. Echo 01/15/2022: EF 60 to 65%.  Mild LVH.  Normal diastolic parameters.  Normal RV function.  Moderate LAE.  Cannot rule out PFO.  No significant valvular abnormalities. Hyperlipidemia. Lipid panel 10/18/2022: LDL 111, HDL 31, TG 92, total 160. T2DM.     History of Present Illness    [name] was first evaluated by Dr. Servando Salina on 01/01/2022 for palpitations and hypertension.  She was found to be hypertensive at the time of her visit.  She was started on hydrochlorothiazide and instructed to provide BP readings the following week with the likelihood of adding an ARB at that time.  She wore 7-day ZIO for palpitations which showed 2 episodes of symptomatic NSVT and asymptomatic PSVT.  On follow-up she was referred to Pharm.D. weight loss clinic.  Patient was last seen in the office by Dr. Servando Salina on 05/28/2022 for routine follow-up.  At the time of her visit she was not taking antihypertensives and was found to be hypertensive.  Amlodipine was stopped and patient was started on Cardizem and instructed to continue hydrochlorothiazide.  She followed up with Pharm.D. hypertensive clinic on 08/20/2022 and was found to have uncontrolled hypertension with BP readings 162/106 and 158/108.  She reported improved medication adherence with only a couple of missed doses of diltiazem.  She did not want to add or change any medications at that time.  She was instructed to implement a pillbox as a reminder for taking medications and improve adherence.  Patient presented to the ED on 10/16/2022 with complaints  of a 3-day history of tachycardia and shortness of breath.  EKG showed sinus tachycardia with heart rate 126 bpm.  Patient was found to be in DKA and admitted.  D-dimer was slightly elevated.  CTA was negative for PE.  She was discharged on 10/18/2022 with insulin regimen, glucose monitor, metformin and instructed to follow-up with [place] [facility] diabetes education services in August.  Today, patient is doing well. Her blood sugar is under better control since discharge from hospital. Patient denies shortness of breath or dyspnea on exertion. No chest pain, pressure, or tightness. Denies lower extremity edema, orthopnea, or PND. She has occasional palpitations but well controlled on diltiazem. She just recently started on Ozempic and has a plan to begin exercise at home on stationary bicycle and row machine. She also plans to start a walking program. She is instructed to start out slow and increase as tolerated. She is hypertensive today. Her PCP recently increase irbesartan. She has not been checking BP at home.     ROS: All other systems reviewed and are otherwise negative except as noted in History of Present Illness.  Studies Reviewed      EKG is not ordered today.   Risk Assessment/Calculations      HYPERTENSION CONTROL Vitals:   11/07/22 0917 11/07/22 0950  BP: (!) 164/100 (!) 148/80    The patient's blood pressure is elevated above target today.  In order to address the patient's elevated BP: A referral to the PharmD Hypertension Clinic will be placed.  Physical Exam    VS:  BP (!) 164/100 (BP Location: Left Arm, Patient Position: Sitting, Cuff Size: Large)   Pulse 75   Ht 5\' 6"  (1.676 m)   Wt 250 lb 12.8 oz (113.8 kg)   SpO2 99%   BMI 40.48 kg/m  , BMI Body mass index is 40.48 kg/m.  GEN: Well nourished, well developed, in no acute distress. Neck: No JVD or carotid bruits. Cardiac:  RRR. No murmurs. No rubs or gallops.   Respiratory:  Respirations  regular and unlabored. Clear to auscultation without rales, wheezing or rhonchi. GI: Soft, nontender, nondistended. Extremities: Radials/DP/PT 2+ and equal bilaterally. No clubbing or cyanosis. No edema.  Skin: Warm and dry, no rash. Neuro: Strength intact.  Assessment & Plan    Palpitations.  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  Patient reports well controlled palpitations with occasional breakthrough.  RRR on exam today. Continue diltiazem. Hypertension: BP today 164/100 on intake and 148/80 on my recheck. She has not been checking BP at home. Patient denies headaches, dizziness or vision changes. Instructed to take BP at home. Continue diltiazem and irbesartan. Hyperlipidemia.  LDL 111, not at goal.  Continue rosuvastatin.  Disposition: Pharm D HTN clinic in 2 months. Follow up in 6 months or return sooner as needed.          Signed, Etta Grandchild. Olamide Carattini, DNP, NP-C

## 2022-11-07 ENCOUNTER — Encounter: Payer: Self-pay | Admitting: Student

## 2022-11-07 ENCOUNTER — Ambulatory Visit: Payer: No Typology Code available for payment source | Attending: Cardiology | Admitting: Student

## 2022-11-07 VITALS — BP 148/80 | HR 75 | Ht 66.0 in | Wt 250.8 lb

## 2022-11-07 DIAGNOSIS — I10 Essential (primary) hypertension: Secondary | ICD-10-CM

## 2022-11-07 DIAGNOSIS — E785 Hyperlipidemia, unspecified: Secondary | ICD-10-CM | POA: Diagnosis not present

## 2022-11-07 DIAGNOSIS — R002 Palpitations: Secondary | ICD-10-CM

## 2022-11-07 NOTE — Patient Instructions (Signed)
Medication Instructions:  No Changes *If you need a refill on your cardiac medications before your next appointment, please call your pharmacy*   Lab Work: No Labs If you have labs (blood work) drawn today and your tests are completely normal, you will receive your results only by: MyChart Message (if you have MyChart) OR A paper copy in the mail If you have any lab test that is abnormal or we need to change your treatment, we will call you to review the results.   Testing/Procedures: No Testing   Follow-Up: At [facility], you and your health needs are our priority.  As part of our continuing mission to provide you with exceptional heart care, we have created designated Provider Care Teams.  These Care Teams include your primary Cardiologist (physician) and Advanced Practice Providers (APPs -  Physician Assistants and Nurse Practitioners) who all work together to provide you with the care you need, when you need it.  We recommend signing up for the patient portal called "MyChart".  Sign up information is provided on this After Visit Summary.  MyChart is used to connect with patients for Virtual Visits (Telemedicine).  Patients are able to view lab/test results, encounter notes, upcoming appointments, etc.  Non-urgent messages can be sent to your provider as well.   To learn more about what you can do with MyChart, go to [email or web address].    Your next appointment:  2 month Pharm-D   Provider:   Thomasene Ripple, DO  6 months

## 2022-11-08 ENCOUNTER — Encounter: Payer: Self-pay | Admitting: Student

## 2022-11-08 DIAGNOSIS — I4729 Other ventricular tachycardia: Secondary | ICD-10-CM | POA: Insufficient documentation

## 2022-11-19 ENCOUNTER — Encounter: Payer: Self-pay | Admitting: Internal Medicine

## 2022-11-19 ENCOUNTER — Ambulatory Visit (INDEPENDENT_AMBULATORY_CARE_PROVIDER_SITE_OTHER): Payer: No Typology Code available for payment source | Admitting: Internal Medicine

## 2022-11-19 VITALS — BP 170/111 | HR 80 | Temp 97.8°F | Ht 66.0 in | Wt 252.1 lb

## 2022-11-19 DIAGNOSIS — I10 Essential (primary) hypertension: Secondary | ICD-10-CM | POA: Diagnosis not present

## 2022-11-19 DIAGNOSIS — E1165 Type 2 diabetes mellitus with hyperglycemia: Secondary | ICD-10-CM | POA: Diagnosis not present

## 2022-11-19 DIAGNOSIS — Z7984 Long term (current) use of oral hypoglycemic drugs: Secondary | ICD-10-CM

## 2022-11-19 MED ORDER — SEMAGLUTIDE(0.25 OR 0.5MG/DOS) 2 MG/3ML ~~LOC~~ SOPN: 0.5000 mg | PEN_INJECTOR | SUBCUTANEOUS | 1 refills | Status: DC

## 2022-11-19 MED ORDER — HYDROCHLOROTHIAZIDE 12.5 MG PO CAPS: 12.5000 mg | ORAL_CAPSULE | Freq: Every day | ORAL | 2 refills | Status: AC

## 2022-11-19 NOTE — Progress Notes (Signed)
   CC: DM f/u  HPI:  [name] is a 47 y.o. female with past medical history as detailed below who presents today for T2DM f/u. Please see problem based charting for detailed assessment and plan.  Past Medical History:  Diagnosis Date   Allergy    Diabetic ketoacidosis (HCC) 10/16/2022   Dichorionic diamniotic twin gestation 06/07/2013   Gestational diabetes    Hypertension    no meds now   Mild or unspecified pre-eclampsia, with delivery 06/07/2013   Morbid obesity (HCC)    Postpartum care following cesarean delivery - twins (2/20) 06/07/2013   Review of Systems:  Negative unless otherwise stated.  Physical Exam:  Vitals:   11/19/22 1509 11/19/22 1539  BP: (!) 148/95 (!) 170/111  Pulse: 76 80  Temp: 97.8 F (36.6 C)   TempSrc: Oral   SpO2: 100%   Weight: 252 lb 1.6 oz (114.4 kg)   Height: 5\' 6"  (1.676 m)    Constitutional:Well-appearing in no acute distress. Pulm:Normal work of breathing on room air. QIO:[id number] for extremity edema. Skin:Warm and dry. Neuro:Alert and oriented x3. No focal deficit noted. Psych:Pleasant mood and affect.  Assessment & Plan:   See Encounters Tab for problem based charting.  Type 2 diabetes mellitus with hyperglycemia, without long-term current use of insulin (HCC) CGM review over last 1 month: Average 162 % Very High 5 % High 22 % In range 73 % Low 0 % Very low 0  CGM review over 14 days: Average 145 % Very High 0 % High 12 % In range 88 % Low 0 % Very low 0  Prescribed regimen is lantus 35 units daily, humalog 5 units TID before meals, metformin 500 mg daily, semaglutide 0.25 mg injection weekly. She has not been taking lantus or humalog due to overnighy hypoglycemic episodes which are asymptomatic but she is alerted by her CGM. HbA1c 10/2022 12.2%.  Only taking metformin Was taking lantus at night Never started humalog Is doing semaglutide Wednesday will be week 4  Plan:Increase semaglutide to 0.5 mg once  weekly starting next Wednesday 08/14. Continue metformin 500 mg daily. Will not restart lantus or humalog at this time. F/u in 4 weeks.  Hypertension BP today 148/95, on recheck 170/111. OP regimen is diltiazem 180 mg daily and irbesartan 150 mg daily. She does not check her BP at home.  Plan:I have requested that she obtain a BP cuff for home measurements 3-4 times per week and that she keep a log to show [place] at her next OV. Continue diltiazem 180 mg daily, irbesartan 150 mg daily. Start hydrochlorothiazide 12.5 mg daily. BMP today. F/u for recheck in 4 weeks.  Patient discussed with Dr.  Sol Blazing

## 2022-11-19 NOTE — Patient Instructions (Signed)
Ms. [name],  It was nice seeing you today! Thank you for choosing [facility] for your Primary Care.    I have increased the dose of your semaglutide to 0.5 mg injection once weekly. Start this next week on Wednesday, 08/14. Continue metformin 500 mg daily.  I have added an additional medication for your blood pressure called hydrochlorothiazide. Take this once daily in addition to irbesartan and diltiazem. I will let you know if your lab results today are abnormal. Please obtain a blood pressure cuff and keep a log of your BP 3-4 times per week.  Keep up the excellent work! If you have symptomatic low blood sugar levels consistently let the clinic know so we can bring you in sooner, otherwise we will see you back in about 4 weeks for follow up.  My best, Dr. August Saucer

## 2022-11-19 NOTE — Assessment & Plan Note (Signed)
BP today 148/95, on recheck 170/111. OP regimen is diltiazem 180 mg daily and irbesartan 150 mg daily. She does not check her BP at home.  Plan:I have requested that she obtain a BP cuff for home measurements 3-4 times per week and that she keep a log to show [place] at her next OV. Continue diltiazem 180 mg daily, irbesartan 150 mg daily. Start hydrochlorothiazide 12.5 mg daily. BMP today. F/u for recheck in 4 weeks.

## 2022-11-19 NOTE — Assessment & Plan Note (Signed)
CGM review over last 1 month: Average 162 % Very High 5 % High 22 % In range 73 % Low 0 % Very low 0  CGM review over 14 days: Average 145 % Very High 0 % High 12 % In range 88 % Low 0 % Very low 0  Prescribed regimen is lantus 35 units daily, humalog 5 units TID before meals, metformin 500 mg daily, semaglutide 0.25 mg injection weekly. She has not been taking lantus or humalog due to overnighy hypoglycemic episodes which are asymptomatic but she is alerted by her CGM. HbA1c 10/2022 12.2%.  Only taking metformin Was taking lantus at night Never started humalog Is doing semaglutide Wednesday will be week 4  Plan:Increase semaglutide to 0.5 mg once weekly starting next Wednesday 08/14. Continue metformin 500 mg daily. Will not restart lantus or humalog at this time. F/u in 4 weeks.

## 2022-11-22 NOTE — Progress Notes (Signed)
Internal Medicine Clinic Attending ? ?Case discussed with Dr. Dean  At the time of the visit.  We reviewed the resident?s history and exam and pertinent patient test results.  I agree with the assessment, diagnosis, and plan of care documented in the resident?s note.  ?

## 2022-12-14 ENCOUNTER — Ambulatory Visit: Payer: No Typology Code available for payment source | Admitting: Skilled Nursing Facility1

## 2023-01-04 ENCOUNTER — Other Ambulatory Visit: Payer: Self-pay | Admitting: Student

## 2023-01-04 DIAGNOSIS — E1165 Type 2 diabetes mellitus with hyperglycemia: Secondary | ICD-10-CM

## 2023-01-07 ENCOUNTER — Ambulatory Visit: Payer: No Typology Code available for payment source | Attending: Student

## 2023-01-07 NOTE — Progress Notes (Deleted)
Patient ID: [name]                 DOB: Aug 09, 1975                      MRN: [id number]      HPI: [name] is a 47 y.o. female referred by Dr. Servando Salina to HTN clinic. PMH is significant for gestational diabetes, hypertension, morbid obesity, medication non-adherence. Patient reported some of the BP medications was cost prohibitive so ended up referring to LCSW. On 10/16/2022 patient went to ED for DKA. Patient was put on basal insulin and metformin. Hydrochlorothiazide was d/c. Patient saw PCP on 10/29/2022 Ozempic was initiated and irbesartan dose was increased from 75 mg daily to 150 mg daily. LDLc  was not goal 10/18/2022 Crestor 10 mg was started. Patient visited [place], [place] on 11/19/2022 hydrochlorothiazide was added to her BP medication regimen. Patient was not using her insulin her Ozempic dose was titrated up and metformin was continued sam as before 500 mg daily.    Patient presented today for BP follow up. Patient has not been checking her BP at home at [facility] visit she was told it was good. (120/75- something like that). She is very disappointment that her insurance does not cover GLP1 for weight loss, she has been gaining even more weight after her IUD placement. She may have skipped taking her Diltiazem couple times during last week. Her 1 month supply for diltiazem costing her around $40 which can be cost prohibitive sometime. Found out it would cost only $26 through [place] next time use [facility]. She has been taking her HCTZ regularly. She is single mother without lot of help so it is hard for her. Her diet has improved a lot. Was eating out 3-4 times per week but now eats out once week. She cooks without salt and doesn't add salt to her cooked food. As weather is nice she try to go for walks and does 1 mile of bike. She thinks she could be more consistent with her exercise schedule. On 05/06 patient saw   Current HTN meds: diltiazem 180 mg daily, Avapro 75 mg daily  Previously  tried: amlodipine 5 mg daily  BP goal: <130/80 No BP recorded.  {Refresh Note OR Click here to enter BP  :1}***  Family History:  Mother: CAD, HF, MI First cousin AV valve replaced  Maternal uncle : MI Maternal aunt : MI  Social History:  Alcohol: none Smoking: quit Jan 2024, smoked for 5 years 1 pack per week    Exercise: walking 2-3 times week, bike - 1 mile     Home BP readings: does not check at home   Wt Readings from Last 3 Encounters:  11/19/22 252 lb 1.6 oz (114.4 kg)  11/07/22 250 lb 12.8 oz (113.8 kg)  10/29/22 255 lb 8 oz (115.9 kg)   BP Readings from Last 3 Encounters:  11/19/22 (!) 170/111  11/07/22 (!) 148/80  10/29/22 (!) 144/81   Pulse Readings from Last 3 Encounters:  11/19/22 80  11/07/22 75  10/29/22 67    Renal function: CrCl cannot be calculated (Patient's most recent lab result is older than the maximum 21 days allowed.).  Past Medical History:  Diagnosis Date   Allergy    Diabetic ketoacidosis (HCC) 10/16/2022   Dichorionic diamniotic twin gestation 06/07/2013   Gestational diabetes    Hypertension    no meds now   Mild or unspecified pre-eclampsia,  with delivery 06/07/2013   Morbid obesity (HCC)    Postpartum care following cesarean delivery - twins (2/20) 06/07/2013    Current Outpatient Medications on File Prior to Visit  Medication Sig Dispense Refill   Continuous Glucose Sensor (FREESTYLE LIBRE 3 SENSOR) MISC PLACE 1 SENSOR ON THE SKIN EVERY 14 DAYS USE TO CHECK GLUCOSE CONTINUOUSLY 2 each 2   diltiazem (CARDIZEM LA) 180 MG 24 hr tablet Take 180 mg by mouth daily.     hydrochlorothiazide (MICROZIDE) 12.5 MG capsule Take 1 capsule (12.5 mg total) by mouth daily. 30 capsule 2   Insulin Pen Needle 32G X 4 MM MISC Change with every use 100 each 3   irbesartan (AVAPRO) 75 MG tablet Take 2 tablets (150 mg total) by mouth daily. 60 tablet 2   metFORMIN (GLUCOPHAGE-XR) 500 MG 24 hr tablet Take 1 tablet (500 mg total) by mouth daily with  breakfast. 30 tablet 2   rosuvastatin (CRESTOR) 10 MG tablet Take 1 tablet (10 mg total) by mouth daily. 30 tablet 2   Semaglutide,0.25 or 0.5MG /DOS, 2 MG/3ML SOPN Inject 0.5 mg into the skin once a week. 3 mL 1   No current facility-administered medications on file prior to visit.    No Known Allergies  There were no vitals taken for this visit.   Assessment/Plan:  1. Hypertension -  No problem-specific Assessment & Plan notes found for this encounter.      Thank you  Carmela Hurt, Pharm.D  [facility] [address] [place], [place] [address]  Phone: [phone]; Fax: [phone]

## 2023-01-10 ENCOUNTER — Encounter: Payer: Self-pay | Admitting: Student

## 2023-02-04 ENCOUNTER — Other Ambulatory Visit: Payer: Self-pay | Admitting: Cardiology

## 2023-02-04 ENCOUNTER — Other Ambulatory Visit: Payer: Self-pay | Admitting: Internal Medicine

## 2023-02-05 MED ORDER — SEMAGLUTIDE (1 MG/DOSE) 4 MG/3ML ~~LOC~~ SOPN: 1.0000 mg | PEN_INJECTOR | SUBCUTANEOUS | 3 refills | Status: DC

## 2023-02-05 NOTE — Telephone Encounter (Signed)
Will increase semaglutide to 1 mg weekly.  Marrianne Mood MD 02/05/2023, 3:18 PM

## 2023-06-04 ENCOUNTER — Other Ambulatory Visit: Payer: Self-pay | Admitting: Student

## 2023-06-04 ENCOUNTER — Encounter: Payer: Self-pay | Admitting: *Deleted

## 2023-06-04 DIAGNOSIS — I10 Essential (primary) hypertension: Secondary | ICD-10-CM

## 2023-06-04 NOTE — Telephone Encounter (Signed)
 LOV 11/19/2022 - BP was 170/111. Pt was called to schedule an appt - no answer and mailbox is full, unable to leave a message. I will send a My Chart message .

## 2023-06-05 ENCOUNTER — Other Ambulatory Visit: Payer: Self-pay

## 2023-06-05 MED ORDER — ROSUVASTATIN CALCIUM 10 MG PO TABS: 10.0000 mg | ORAL_TABLET | Freq: Every day | ORAL | 1 refills | Status: AC

## 2023-06-05 NOTE — Telephone Encounter (Signed)
 Pharmacy requesting a 90 day supply  Medication sent to pharmacy.

## 2023-12-17 ENCOUNTER — Ambulatory Visit (INDEPENDENT_AMBULATORY_CARE_PROVIDER_SITE_OTHER)

## 2023-12-17 ENCOUNTER — Other Ambulatory Visit (HOSPITAL_COMMUNITY)
Admission: RE | Admit: 2023-12-17 | Discharge: 2023-12-17 | Disposition: A | Discharge status: Home / Self Care | Source: Ambulatory Visit | Attending: Obstetrics and Gynecology | Admitting: Obstetrics and Gynecology

## 2023-12-17 VITALS — BP 139/88 | HR 79

## 2023-12-17 DIAGNOSIS — N898 Other specified noninflammatory disorders of vagina: Secondary | ICD-10-CM

## 2023-12-17 NOTE — Progress Notes (Signed)
 SUBJECTIVE:  48 y.o. female complains of vaginal itching for 1 week(s). Denies abnormal vaginal bleeding or significant pelvic pain or fever. No UTI symptoms. Denies history of known exposure to STD.  No LMP recorded.  OBJECTIVE:  She appears well, afebrile. Urine dipstick: not done.  ASSESSMENT:  Vaginal Discharge  Vaginal Itching   PLAN:  GC, chlamydia, trichomonas, BVAG, CVAG probe sent to lab. Treatment: To be determined once lab results are received ROV prn if symptoms persist or worsen.

## 2023-12-18 LAB — CERVICOVAGINAL ANCILLARY ONLY
Bacterial Vaginitis (gardnerella): POSITIVE — AB
Candida Glabrata: POSITIVE — AB
Candida Vaginitis: POSITIVE — AB
Chlamydia: NEGATIVE
Comment: NEGATIVE
Comment: NEGATIVE
Comment: NEGATIVE
Comment: NEGATIVE
Comment: NEGATIVE
Comment: NORMAL
Neisseria Gonorrhea: NEGATIVE
Trichomonas: NEGATIVE

## 2023-12-19 ENCOUNTER — Ambulatory Visit: Payer: Self-pay | Admitting: Obstetrics and Gynecology

## 2023-12-25 ENCOUNTER — Other Ambulatory Visit: Payer: Self-pay

## 2023-12-25 MED ORDER — BORIC ACID CRYS: 600.0000 mg | CRYSTALS | Freq: Every day | 0 refills | Status: AC

## 2023-12-25 MED ORDER — METRONIDAZOLE 500 MG PO TABS: 500.0000 mg | ORAL_TABLET | Freq: Two times a day (BID) | ORAL | 0 refills | Status: DC

## 2024-01-07 ENCOUNTER — Other Ambulatory Visit (HOSPITAL_COMMUNITY)
Admission: RE | Admit: 2024-01-07 | Discharge: 2024-01-07 | Disposition: A | Source: Ambulatory Visit | Attending: Advanced Practice Midwife | Admitting: Advanced Practice Midwife

## 2024-01-07 ENCOUNTER — Ambulatory Visit: Admitting: Advanced Practice Midwife

## 2024-01-07 ENCOUNTER — Encounter: Payer: Self-pay | Admitting: Advanced Practice Midwife

## 2024-01-07 VITALS — BP 153/89 | HR 96 | Ht 65.0 in | Wt 222.6 lb

## 2024-01-07 DIAGNOSIS — Z01419 Encounter for gynecological examination (general) (routine) without abnormal findings: Secondary | ICD-10-CM

## 2024-01-07 DIAGNOSIS — Z124 Encounter for screening for malignant neoplasm of cervix: Secondary | ICD-10-CM

## 2024-01-07 DIAGNOSIS — Z1239 Encounter for other screening for malignant neoplasm of breast: Secondary | ICD-10-CM | POA: Diagnosis not present

## 2024-01-07 DIAGNOSIS — Z1211 Encounter for screening for malignant neoplasm of colon: Secondary | ICD-10-CM

## 2024-01-07 DIAGNOSIS — B3731 Acute candidiasis of vulva and vagina: Secondary | ICD-10-CM

## 2024-01-07 DIAGNOSIS — Z113 Encounter for screening for infections with a predominantly sexual mode of transmission: Secondary | ICD-10-CM | POA: Diagnosis present

## 2024-01-07 MED ORDER — FLUCONAZOLE 150 MG PO TABS
ORAL_TABLET | ORAL | 0 refills | Status: DC
Start: 2024-01-07 — End: 2024-01-20

## 2024-01-07 MED ORDER — TRIAMCINOLONE ACETONIDE 0.5 % EX OINT
1.0000 | TOPICAL_OINTMENT | Freq: Two times a day (BID) | CUTANEOUS | 0 refills | Status: AC
Start: 2024-01-07 — End: ?

## 2024-01-07 NOTE — Progress Notes (Signed)
 Pt presents for AEX Last PAP unknown  Requesting PAP and STD testing  Last mammogram unknown  Needs colonoscopy   Pt reports vaginal irritation. Swab done 12-17-23

## 2024-01-07 NOTE — Progress Notes (Signed)
 Subjective:     Miamor Ayler is a 48 y.o. female here at [facility] for a routine exam.  Current complaints: vaginal itching/yeast infection.  Personal and family health history reviewed: yes.  Do you have a primary care provider? yes Do you feel safe at home? yes  Flowsheet Row Office Visit from 01/07/2024 in [facility] for [facility] at [facility] Total Score 0    Health Maintenance Due  Topic Date Due   FOOT EXAM  Never done   OPHTHALMOLOGY EXAM  Never done   Diabetic kidney evaluation - Urine ACR  Never done   Hepatitis C Screening  Never done   DTaP/Tdap/Td (1 - Tdap) Never done   Pneumococcal Vaccine (1 of 2 - PCV) Never done   Hepatitis B Vaccines 19-59 Average Risk (1 of 3 - 19+ 3-dose series) Never done   Cervical Cancer Screening (HPV/Pap Cotest)  Never done   Mammogram  11/06/2016   Colonoscopy  Never done   HEMOGLOBIN A1C  04/18/2023   Influenza Vaccine  Never done   Diabetic kidney evaluation - eGFR measurement  11/19/2023   COVID-19 Vaccine (1 - 2024-25 season) Never done     Risk factors for chronic health problems: Smoking: Alchohol/how much: Pt BMI: Body mass index is 37.04 kg/m.   Gynecologic History No LMP recorded. (Menstrual status: IUD). Contraception: IUD Last Pap: Pt unsure.  Last mammogram: 2024 per patient. Results were: normal  Obstetric History OB History  Gravida Para Term Preterm AB Living  4 1  1 3 2   SAB IAB Ectopic Multiple Live Births  1 1 1 2 2     # Outcome Date GA Lbr Len/2nd Weight Sex Type Anes PTL Lv  4A Ectopic           4B Gravida           3A Preterm 06/05/13 [redacted]w[redacted]d  6 lb 0.1 oz (2.725 kg) F CS-LTranv Spinal  LIV  3B Preterm 06/05/13 [redacted]w[redacted]d  5 lb 7.5 oz (2.48 kg) F CS-LTranv Spinal  LIV  2 SAB           1 IAB              The following portions of the patient's history were reviewed and updated as appropriate: allergies, current medications, past family history, past medical history, past social  history, past surgical history, and problem list.  Review of Systems Pertinent items noted in HPI and remainder of comprehensive ROS otherwise negative.    Objective:   Today's Vitals   01/07/24 1331 01/07/24 1340  BP: (!) 161/96 (!) 153/89  Pulse: 96 96  Weight: 222 lb 9.6 oz (101 kg)   Height: 5' 5 (1.651 m)    Body mass index is 37.04 kg/m.  VS reviewed, nursing note reviewed,  Constitutional: well developed, well nourished, no distress HEENT: normocephalic, thyroid without enlargement or mass HEART: RRR, no murmurs rubs/gallops RESP: clear and equal to auscultation bilaterally in all lobes  Breast Exam: exam performed: right breast normal without mass, skin or nipple changes or axillary nodes, left breast normal without mass, skin or nipple changes or axillary nodes Abdomen: soft Neuro: alert and oriented x 3 Skin: warm, dry Psych: affect normal Pelvic exam: Performed: Cervix pink, visually closed, without lesion,thick white discharge in vaginal vault and on labia, vaginal walls and external genitalia normal Bimanual exam: Cervix 0/long/high, firm, anterior, neg CMT, uterus nontender, nonenlarged, adnexa without tenderness, enlargement, or mass  Assessment/Plan:  1. Encounter for annual routine gynecological examination   2. Routine screening for STI (sexually transmitted infection) (Primary)  - HIV antibody (with reflex) - RPR - Hepatitis C Antibody - Hepatitis B Surface AntiGEN --Gonorrhea/chlamydia on pap  3. Vaginal candidiasis --Clinical evidence of yeast. Pt recently treated for BV with Flagyl .  Rx for boric acid suppositories for Candida Glabrata but pt did not pick up.   --Will try longer course of Diflucan , retest if still symptomatic --Topical steroid for comfort - fluconazole  (DIFLUCAN ) 150 MG tablet; Take one tablet now (Day1), the second tablet on Day 3, and the third tablet on Day 6.  Dispense: 3 tablet; Refill: 0 - triamcinolone  ointment  (KENALOG ) 0.5 %; Apply 1 Application topically 2 (two) times daily.  Dispense: 30 g; Refill: 0  4. Encounter for screening for malignant neoplasm of breast, unspecified screening modality  - MM 3D SCREENING MAMMOGRAM BILATERAL BREAST; Future  5. Cervical cancer screening - Cytology - PAP( [place])  6. Screen for colon cancer --Pt has not had colonoscopy - Ambulatory referral to Gastroenterology    No follow-ups on file.   Olam Boards, CNM 1:45 PM

## 2024-01-08 LAB — HIV ANTIBODY (ROUTINE TESTING W REFLEX): HIV Screen 4th Generation wRfx: NONREACTIVE

## 2024-01-08 LAB — RPR: RPR Ser Ql: NONREACTIVE

## 2024-01-08 LAB — HEPATITIS B SURFACE ANTIGEN: Hepatitis B Surface Ag: NEGATIVE

## 2024-01-08 LAB — HEPATITIS C ANTIBODY: Hep C Virus Ab: NONREACTIVE

## 2024-01-09 LAB — CYTOLOGY - PAP
Adequacy: ABSENT
Chlamydia: NEGATIVE
Comment: NEGATIVE
Comment: NEGATIVE
Comment: NORMAL
Diagnosis: NEGATIVE
High risk HPV: NEGATIVE
Neisseria Gonorrhea: NEGATIVE

## 2024-01-20 ENCOUNTER — Ambulatory Visit

## 2024-01-20 VITALS — BP 161/97 | HR 76 | Temp 97.9°F | Ht 65.0 in | Wt 217.4 lb

## 2024-01-20 DIAGNOSIS — Z7984 Long term (current) use of oral hypoglycemic drugs: Secondary | ICD-10-CM | POA: Diagnosis not present

## 2024-01-20 DIAGNOSIS — Z0000 Encounter for general adult medical examination without abnormal findings: Secondary | ICD-10-CM | POA: Insufficient documentation

## 2024-01-20 DIAGNOSIS — I4729 Other ventricular tachycardia: Secondary | ICD-10-CM

## 2024-01-20 DIAGNOSIS — Z833 Family history of diabetes mellitus: Secondary | ICD-10-CM

## 2024-01-20 DIAGNOSIS — Z2821 Immunization not carried out because of patient refusal: Secondary | ICD-10-CM

## 2024-01-20 DIAGNOSIS — Z87891 Personal history of nicotine dependence: Secondary | ICD-10-CM

## 2024-01-20 DIAGNOSIS — I10 Essential (primary) hypertension: Secondary | ICD-10-CM

## 2024-01-20 DIAGNOSIS — Z8249 Family history of ischemic heart disease and other diseases of the circulatory system: Secondary | ICD-10-CM

## 2024-01-20 DIAGNOSIS — Z79899 Other long term (current) drug therapy: Secondary | ICD-10-CM

## 2024-01-20 DIAGNOSIS — E1165 Type 2 diabetes mellitus with hyperglycemia: Secondary | ICD-10-CM

## 2024-01-20 LAB — POCT GLYCOSYLATED HEMOGLOBIN (HGB A1C): HbA1c, POC (controlled diabetic range): 13.8 % — AB (ref 0.0–7.0)

## 2024-01-20 LAB — GLUCOSE, CAPILLARY: Glucose-Capillary: 406 mg/dL — ABNORMAL HIGH (ref 70–99)

## 2024-01-20 MED ORDER — DILTIAZEM HCL ER 180 MG PO TB24: 180.0000 mg | ORAL_TABLET | Freq: Every day | ORAL | 1 refills | Status: DC

## 2024-01-20 MED ORDER — METFORMIN HCL 1000 MG PO TABS: 1000.0000 mg | ORAL_TABLET | Freq: Two times a day (BID) | ORAL | 0 refills | Status: DC

## 2024-01-20 MED ORDER — EMPAGLIFLOZIN 10 MG PO TABS: 10.0000 mg | ORAL_TABLET | Freq: Every day | ORAL | 1 refills | Status: DC

## 2024-01-20 MED ORDER — SEMAGLUTIDE(0.25 OR 0.5MG/DOS) 2 MG/3ML ~~LOC~~ SOPN: 0.2500 mg | PEN_INJECTOR | SUBCUTANEOUS | 2 refills | Status: DC

## 2024-01-20 NOTE — Progress Notes (Addendum)
 Established Patient Office Visit  Subjective   Patient ID: [name], female    DOB: Jun 16, 1975  Age: 48 y.o. MRN: [id number]  Chief Complaint  Patient presents with   Medication Refill   Diabetes    HPI [name] is a 48 year old female past medical history of uncontrolled type 2 diabetes not on insulin , morbid obesity, hypertension, hyperlipidemia that presents today for follow-up.  Of note patient lost insurance coverage earlier this year and has not been on most of her medications.  See problem-based assessment for more details Of note patient did endorse increased urinary frequency.  Was recently treated for bacterial vaginosis and yeast infection.  Denies any dysuria  ROS See problem-based assessment for details   Objective:     BP (!) 161/97 (BP Location: Right Arm, Patient Position: Sitting, Cuff Size: Small)   Pulse 76   Temp 97.9 F (36.6 C) (Oral)   Ht 5' 5 (1.651 m)   Wt 217 lb 6.4 oz (98.6 kg)   SpO2 99%   BMI 36.18 kg/m  BP Readings from Last 3 Encounters:  01/20/24 (!) 161/97  01/07/24 (!) 153/89  12/17/23 139/88   Wt Readings from Last 3 Encounters:  01/20/24 217 lb 6.4 oz (98.6 kg)  01/07/24 222 lb 9.6 oz (101 kg)  11/19/22 252 lb 1.6 oz (114.4 kg)      Physical Exam Constitution: Alert, no acute distress Heart: Regular rate and rhythm, no murmurs heard Lungs: Respiratory effort normal, lungs clear to auscultation Feet: Good DP pulses bilaterally, no open wounds  Results for orders placed or performed in visit on 01/20/24  Glucose, capillary  Result Value Ref Range   Glucose-Capillary 406 (H) 70 - 99 mg/dL   Comment 1 Call MD NNP PA CNM    Comment 2 Document in Chart   POC Hbg A1C  Result Value Ref Range   Hemoglobin A1C     HbA1c POC (<> result, manual entry)     HbA1c, POC (prediabetic range)     HbA1c, POC (controlled diabetic range) 13.8 (A) 0.0 - 7.0 %    Last hemoglobin A1c Lab Results  Component Value Date   HGBA1C  13.8 (A) 01/20/2024      The 10-year ASCVD risk score (Arnett DK, et al., 2019) is: 33%    Assessment & Plan:   Problem List Items Addressed This Visit     Hypertension   Will start patient on diltiazem  180 mg as patient states this was used for blood pressure and her nonsustained V. tach.  Will check CMP today to check for kidney function and also screen for MAFLD.      Relevant Medications   diltiazem  (CARDIZEM  LA) 180 MG 24 hr tablet   Type 2 diabetes mellitus with hyperglycemia, without long-term current use of insulin  (HCC) - Primary   A1c of 13.8 she is up from 12.1.  Patient has just started taking metformin  500 mg ER in the last week but otherwise not been on any medications due to lack of insurance coverage.  Patient states that she got her eye exam done last month and we will fax over records to us .  Will have to titrate up medications as patient has not been on anything for a while   Plan: Increase metformin  to metformin  1000 mg twice a day Start Jardiance 10 mg Will start back on semaglutide  at 0.25 mg for 2 weeks weekly and patient can titrate up to 0.5 from there weekly if tolerating  Microalbumin/creatinine ratio today      Relevant Medications   empagliflozin (JARDIANCE) 10 MG TABS tablet   metFORMIN  (GLUCOPHAGE ) 1000 MG tablet   Semaglutide ,0.25 or 0.5MG /DOS, 2 MG/3ML SOPN   Other Relevant Orders   POC Hbg A1C (Completed)   Glucose, capillary (Completed)   Microalbumin / Creatinine Urine Ratio   NSVT (nonsustained ventricular tachycardia) (HCC)   Did encourage patient to follow-up with cardiology for this and also for further management.  Patient states that she has not been having any chest pain and has occasional palpitations but nothing that is unusual for her.      Relevant Medications   diltiazem  (CARDIZEM  LA) 180 MG 24 hr tablet   Healthcare maintenance   Patient has colonoscopy, mammogram pending.  Patient will call back on number left on voicemail  to schedule mammogram.  Declined flu shot today      Other Visit Diagnoses       Essential hypertension       Relevant Medications   diltiazem  (CARDIZEM  LA) 180 MG 24 hr tablet   Other Relevant Orders   Comprehensive metabolic panel with GFR        Return in about 3 months (around 04/21/2024).    Melanie Newhart D'Mello, DO Patient seen with Dr. Shawn

## 2024-01-20 NOTE — Assessment & Plan Note (Addendum)
 A1c of 13.8 she is up from 12.1.  Patient has just started taking metformin  500 mg ER in the last week but otherwise not been on any medications due to lack of insurance coverage.  Patient states that she got her eye exam done last month and we will fax over records to us .  Will have to titrate up medications as patient has not been on anything for a while.  Patient also had complaints of increased urinary frequency but denied any dysuria.  Do believe that this could be due to her hyperglycemia    Plan: Increase metformin  to metformin  1000 mg twice a day Start Jardiance 10 mg Will start back on semaglutide  at 0.25 mg for 2 weeks weekly and patient can titrate up to 0.5 from there weekly if tolerating Microalbumin/creatinine ratio today

## 2024-01-20 NOTE — Progress Notes (Signed)
 Internal Medicine Clinic Attending  I was physically present during the key portions of the resident provided service and participated in the medical decision making of patient's management care. I reviewed pertinent patient test results.  The assessment, diagnosis, and plan were formulated together and I agree with the documentation in the resident's note.  Shawn Sick, MD

## 2024-01-20 NOTE — Assessment & Plan Note (Addendum)
 Will start patient on diltiazem  180 mg as patient states this was used for blood pressure and her nonsustained V. tach.  Will check CMP today to check for kidney function and also screen for MAFLD.

## 2024-01-20 NOTE — Assessment & Plan Note (Addendum)
 Patient has colonoscopy, mammogram pending.  Patient will call back on number left on voicemail to schedule mammogram.  Declined flu shot today

## 2024-01-20 NOTE — Assessment & Plan Note (Signed)
 Did encourage patient to follow-up with cardiology for this and also for further management.  Patient states that she has not been having any chest pain and has occasional palpitations but nothing that is unusual for her.

## 2024-01-20 NOTE — Patient Instructions (Addendum)
 Today we discussed the following medical conditions and plan:   Diltiazem  180 mg  Metformin  1000 mg twice a day Jardiance 10 mg once a day Semaglutide  .25 mg for two  weeks, if you are tolerating .5 mg   I want to see you back in 3 months to check your A1c  I will also get labs to check your kidney function and liver function   We look forward to seeing you next time. Please call our clinic at [phone] if you have any questions or concerns. The best time to call is Monday-Friday from 9am-4pm, but there is someone available 24/7. If you need medication refills, please notify your pharmacy one week in advance and they will send us  a request.   Thank you for trusting me with your care. Wishing you the best!   Andilyn Bettcher D'Mello, DO  [facility]

## 2024-01-21 ENCOUNTER — Other Ambulatory Visit: Payer: Self-pay

## 2024-01-21 ENCOUNTER — Ambulatory Visit: Payer: Self-pay

## 2024-01-21 DIAGNOSIS — I10 Essential (primary) hypertension: Secondary | ICD-10-CM

## 2024-01-21 DIAGNOSIS — E1165 Type 2 diabetes mellitus with hyperglycemia: Secondary | ICD-10-CM

## 2024-01-21 LAB — COMPREHENSIVE METABOLIC PANEL WITH GFR
ALT: 14 IU/L (ref 0–32)
AST: 14 IU/L (ref 0–40)
Albumin: 4.2 g/dL (ref 3.9–4.9)
Alkaline Phosphatase: 92 IU/L (ref 41–116)
BUN/Creatinine Ratio: 8 — ABNORMAL LOW (ref 9–23)
BUN: 6 mg/dL (ref 6–24)
Bilirubin Total: 0.2 mg/dL (ref 0.0–1.2)
CO2: 21 mmol/L (ref 20–29)
Calcium: 9.8 mg/dL (ref 8.7–10.2)
Chloride: 95 mmol/L — ABNORMAL LOW (ref 96–106)
Creatinine, Ser: 0.77 mg/dL (ref 0.57–1.00)
Globulin, Total: 2.9 g/dL (ref 1.5–4.5)
Glucose: 362 mg/dL — ABNORMAL HIGH (ref 70–99)
Potassium: 4 mmol/L (ref 3.5–5.2)
Sodium: 134 mmol/L (ref 134–144)
Total Protein: 7.1 g/dL (ref 6.0–8.5)
eGFR: 96 mL/min/1.73 (ref >59)

## 2024-01-21 MED ORDER — DILTIAZEM HCL ER 180 MG PO TB24: 180.0000 mg | ORAL_TABLET | Freq: Every day | ORAL | 1 refills | Status: DC

## 2024-01-21 MED ORDER — SEMAGLUTIDE(0.25 OR 0.5MG/DOS) 2 MG/3ML ~~LOC~~ SOPN: 0.2500 mg | PEN_INJECTOR | SUBCUTANEOUS | 2 refills | Status: AC

## 2024-01-21 MED ORDER — EMPAGLIFLOZIN 10 MG PO TABS: 10.0000 mg | ORAL_TABLET | Freq: Every day | ORAL | 1 refills | Status: AC

## 2024-01-21 MED ORDER — METFORMIN HCL 1000 MG PO TABS: 1000.0000 mg | ORAL_TABLET | Freq: Two times a day (BID) | ORAL | 0 refills | Status: AC

## 2024-01-22 ENCOUNTER — Telehealth: Payer: Self-pay

## 2024-01-22 ENCOUNTER — Other Ambulatory Visit: Payer: Self-pay

## 2024-01-22 DIAGNOSIS — I10 Essential (primary) hypertension: Secondary | ICD-10-CM

## 2024-01-22 LAB — MICROALBUMIN / CREATININE URINE RATIO
Creatinine, Urine: 51.7 mg/dL
Microalb/Creat Ratio: 19 mg/g{creat} (ref 0–29)
Microalbumin, Urine: 10 ug/mL

## 2024-01-22 MED ORDER — DILTIAZEM HCL ER COATED BEADS 180 MG PO CP24: 180.0000 mg | ORAL_CAPSULE | Freq: Every day | ORAL | 11 refills | Status: DC

## 2024-01-22 NOTE — Telephone Encounter (Signed)
 Prior Authorization for patient (dilTIAZem  HCl ER 180MG  er tablets) came through on cover my meds was submitted with last office notes awaiting approval or denial.  XZB:[id number]

## 2024-01-22 NOTE — Telephone Encounter (Signed)
 Williemae Hopkins (KeyBETHA MORE) Rx #: [id number] Ozempic  (0.25 or 0.5 MG/DOSE) 2MG /3ML pen-injectors Form [employer] Electronic Prior Authorization Form (2017 NCPDP) Created 2 days ago Sent to Plan 7 minutes ago Plan Response 6 minutes ago Submit Clinical Questions 5 minutes ago Determination Favorable 3 minutes ago Message from Plan Request Reference Number: [id number]. OZEMPIC  INJ 2MG /3ML is approved through 01/21/2025. For further questions, call [employer] at [phone].SABRA Authorization Expiration Date: January 21, 2025.   Patient is aware of approval.

## 2024-01-22 NOTE — Telephone Encounter (Signed)
 Prior Authorization for patient (Ozempic  (0.25 or 0.5 MG/DOSE) 2MG /3ML pen-injectors) came through on cover my meds was submitted with last office notes awaiting approval or denial.  XZB:[id number]

## 2024-01-22 NOTE — Telephone Encounter (Signed)
 YOU ASKED FOR:  Requested Requested Service Description Code 1 Code 2 Plan Dates Amount Diltiazem  Er Tab Medicaid 01/22/2024 180mg  WE DENIED: Denied Denied Code 1 Code 2 Plan Service Description Dates Amount Diltiazem  Er Tab Medicaid 01/22/2024 180mg  We denied your request for:  Diltiazem  Er Tab 180mg  Policy rules found at Clinical Coverage Policy 9, Outpatient Pharmacy Program guided our decision. Here are the policy requirements your request did not meet: Per your health plan's criteria, this drug is covered if you meet the following: One of the following: (1) You have failed two preferred drugs as confirmed by claims history or submission of medical records. The preferred drugs: Cartia  XT capsule (branded generic for Cardizem  CD), Dilt XR capsule (branded generic for Dilacor XR ), diltiazem  ER 24 hour capsule (generic for Dilacor XR , Tiazac ), diltiazem  tablet /CD capsule / ER 12 hour capsule (generic for Cardizem /CD/SR), Taztia  XT capsule (branded generic for Tiazac ), Tiadylt  ER capsule, verapamil tablet / extended release tablet (generic for Calan/SR). (2) You cannot use two preferred drugs (please specify contraindication or intolerance). The information provided does not show that you meet the criteria listed above. Please speak with your doctor about your choices. This decision was made per the [facility] [place]  NonPreferred Drugs Guideline.

## 2024-01-23 ENCOUNTER — Telehealth: Payer: Self-pay

## 2024-01-23 ENCOUNTER — Other Ambulatory Visit: Payer: Self-pay

## 2024-01-23 DIAGNOSIS — I10 Essential (primary) hypertension: Secondary | ICD-10-CM

## 2024-01-23 NOTE — Telephone Encounter (Signed)
 Prior Authorization for patient (Jardiance 10MG  tablets) came through on cover my meds was submitted with last office notes and labs awaiting approval or denial.  XZB:[id number]

## 2024-01-23 NOTE — Telephone Encounter (Signed)
 Pharmacy requesting a 90 day supply

## 2024-01-23 NOTE — Telephone Encounter (Signed)
 [name] ([name]) PA Case ID #: [id number] Need Help? Call us  at [phone] Outcome Approved today by [facility] 2017 NCPDP Request Reference Number: [id number]. JARDIANCE TAB 10MG  is approved through 01/22/2025. For further questions, call [employer] at [phone]. Effective Date: 01/23/2024 Authorization Expiration Date: 01/22/2025 Drug Jardiance 10MG  tablets ePA cloud logo Form [employer] Electronic Prior Authorization Form [phone] NCPDP)  Patient is aware of approval.

## 2024-01-24 NOTE — Progress Notes (Signed)
 Internal Medicine Clinic Attending  I was physically present during the key portions of the resident provided service and participated in the medical decision making of patient's management care. I reviewed pertinent patient test results.  The assessment, diagnosis, and plan were formulated together and I agree with the documentation in the resident's note.  Shawn Sick, MD

## 2024-05-11 ENCOUNTER — Telehealth

## 2024-05-11 DIAGNOSIS — B3731 Acute candidiasis of vulva and vagina: Secondary | ICD-10-CM | POA: Diagnosis not present

## 2024-05-12 MED ORDER — FLUCONAZOLE 150 MG PO TABS: 150.0000 mg | ORAL_TABLET | ORAL | 0 refills | Status: AC

## 2024-05-12 NOTE — Progress Notes (Signed)

## 2024-05-18 ENCOUNTER — Other Ambulatory Visit: Payer: Self-pay | Admitting: Student

## 2024-05-19 NOTE — Telephone Encounter (Signed)
 Medication discontinued 01/21/24

## 2024-05-22 ENCOUNTER — Ambulatory Visit: Payer: Self-pay

## 2024-05-27 ENCOUNTER — Ambulatory Visit: Admitting: Student

## 2024-05-29 ENCOUNTER — Ambulatory Visit: Admitting: Student
